# Patient Record
Sex: Female | Born: 1965 | Race: White | Hispanic: No | Marital: Married | Smoking: Never smoker
Health system: Southern US, Community
[De-identification: ages and names within clinical notes are randomized; demographics above are authoritative.]

## PROBLEM LIST (undated history)

## (undated) HISTORY — PX: BREAST BIOPSY: SHX20

## (undated) HISTORY — PX: BREAST EXCISIONAL BIOPSY: SUR124

## (undated) HISTORY — PX: BREAST SURGERY: SHX581

---

## 1997-05-15 ENCOUNTER — Inpatient Hospital Stay (HOSPITAL_COMMUNITY): Admission: AD | Admit: 1997-05-15 | Discharge: 1997-05-17 | Payer: Self-pay | Admitting: Obstetrics and Gynecology

## 1997-05-18 ENCOUNTER — Encounter: Admission: RE | Admit: 1997-05-18 | Discharge: 1997-08-16 | Payer: Self-pay | Admitting: *Deleted

## 1998-08-15 ENCOUNTER — Other Ambulatory Visit: Admission: RE | Admit: 1998-08-15 | Discharge: 1998-08-15 | Payer: Self-pay | Admitting: Obstetrics and Gynecology

## 1999-05-06 ENCOUNTER — Inpatient Hospital Stay (HOSPITAL_COMMUNITY): Admission: AD | Admit: 1999-05-06 | Discharge: 1999-05-09 | Payer: Self-pay | Admitting: Gynecology

## 1999-05-10 ENCOUNTER — Encounter: Admission: RE | Admit: 1999-05-10 | Discharge: 1999-07-03 | Payer: Self-pay | Admitting: Gynecology

## 1999-06-17 ENCOUNTER — Other Ambulatory Visit: Admission: RE | Admit: 1999-06-17 | Discharge: 1999-06-17 | Payer: Self-pay | Admitting: Gynecology

## 2000-10-21 ENCOUNTER — Other Ambulatory Visit: Admission: RE | Admit: 2000-10-21 | Discharge: 2000-10-21 | Payer: Self-pay | Admitting: Gynecology

## 2001-12-06 ENCOUNTER — Other Ambulatory Visit: Admission: RE | Admit: 2001-12-06 | Discharge: 2001-12-06 | Payer: Self-pay | Admitting: Gynecology

## 2003-02-13 ENCOUNTER — Other Ambulatory Visit: Admission: RE | Admit: 2003-02-13 | Discharge: 2003-02-13 | Payer: Self-pay | Admitting: Gynecology

## 2004-07-21 ENCOUNTER — Other Ambulatory Visit: Admission: RE | Admit: 2004-07-21 | Discharge: 2004-07-21 | Payer: Self-pay | Admitting: Gynecology

## 2005-07-23 ENCOUNTER — Other Ambulatory Visit: Admission: RE | Admit: 2005-07-23 | Discharge: 2005-07-23 | Payer: Self-pay | Admitting: Gynecology

## 2006-08-08 ENCOUNTER — Other Ambulatory Visit: Admission: RE | Admit: 2006-08-08 | Discharge: 2006-08-08 | Payer: Self-pay | Admitting: Gynecology

## 2007-08-28 ENCOUNTER — Other Ambulatory Visit: Admission: RE | Admit: 2007-08-28 | Discharge: 2007-08-28 | Payer: Self-pay | Admitting: Gynecology

## 2008-08-28 ENCOUNTER — Other Ambulatory Visit: Admission: RE | Admit: 2008-08-28 | Discharge: 2008-08-28 | Payer: Self-pay | Admitting: Gynecology

## 2008-08-28 ENCOUNTER — Encounter: Payer: Self-pay | Admitting: Gynecology

## 2008-08-28 ENCOUNTER — Ambulatory Visit: Payer: Self-pay | Admitting: Gynecology

## 2009-09-08 ENCOUNTER — Ambulatory Visit: Payer: Self-pay | Admitting: Gynecology

## 2009-09-08 ENCOUNTER — Other Ambulatory Visit: Admission: RE | Admit: 2009-09-08 | Discharge: 2009-09-08 | Payer: Self-pay | Admitting: Gynecology

## 2010-05-29 NOTE — Discharge Summary (Signed)
[facility]  Patient:    Deborah Terrell, Deborah Terrell                     MRN: [id number] Adm. Date:  [id number] Disc. Date: [id number] Attending:  Merrily Pew Dictator:   Antony Contras, [facility]                           Discharge Summary  DISCHARGE DIAGNOSES:                 1. Intrauterine pregnancy at term.                                      2. Breech presentation.  PROCEDURE:                           Low cervical transverse cesarean section                                      with delivery of viable infant.  HISTORY OF PRESENT ILLNESS:          Patient is a 45 year old gravida 2, para 1, 0-0-1, with an LMP 08/04/98, [facility] 05/10/99.  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  Patient was thus being followed by this per dermatologist.  PRENATAL BLOOD WORK:                 Blood type O positive, antibody screen negative.  Rubella positive.  RPR, HB, SAG, HIV nonreactive.  MSAFP within normal limits.  GBS negative.  HOSPITAL COURSE AND TREATMENT:       Patient was found to have a breech presentation near term.  Options for external cephalic version versus primary C-section were reviewed and the patient thus elected for a primary C-section. This procedure was performed on 05/06/99 by Dr. Audie Box assisted by Dr. Lily Peer under spinal anesthesia.  Patient was delivered of an Apgar 9/9 female infant.  Weight was 7 pounds 4 ounces.  Estimated blood loss less than 500 cc.  Postpartum course was uncomplicated.  Patient remained afebrile.  No difficulty with voiding.  Postoperative CBC: Hematocrit 28, hemoglobin 10, wbcs 9.9, platelets 190.  She was able to be discharged in satisfactory condition on her third postoperative day.  DISPOSITION:                         Follow up in the office in six weeks.  MEDICATIONS:                         1. Prenatal vitamins.                                      2. Iron.               3. Tylox and Motrin for pain. DD:  05/25/99 TD:  05/26/99 Job: [id number]

## 2010-05-29 NOTE — Op Note (Signed)
[facility]  Patient:    Deborah Terrell, Deborah Terrell                     MRN: [id number] Proc. Date: 05/06/99 Adm. Date:  [id number] Attending:  Merrily Pew                           Operative Report  PREOPERATIVE DIAGNOSIS:       Term pregnancy, breech presentation.  POSTOPERATIVE DIAGNOSIS:      Term pregnancy, breech presentation.  OPERATION:                    Primary low transverse cesarean section.  SURGEON:                      Timothy P. Fontaine, M.D.  ASSISTANTGaetano Hawthorne. Lily Peer, M.D.  ANESTHESIA:                   Spinal.  ESTIMATED BLOOD LOSS:         Less than 500 cc.  COMPLICATIONS:                None.  SPECIMEN:                     Samples of cord blood.  FINDINGS:                     At 88 normal female, Apgars 9 and 9, pelvic anatomy noted to be normal.  DESCRIPTION OF PROCEDURE:     The patient was taken to the operating room and underwent multiple attempts at spinal and epidural anesthesia per anesthesia record with ultimate successful spinal.  The patient was placed in the left tilt supine position and received an abdominal preparation with Betadine scrub and Betadine  solution and a Foley catheter was placed in sterile technique.  The patient was  then draped in the usual fashion and after assuring adequate anesthesia, the abdomen was sharply entered through a Pfannenstiel incision achieving adequate hemostasis at all levels.  The bladder flap was sharply and bluntly developed without difficulty.  The lower uterine segment was sharply incised and bluntly extended laterally.  The membranes ruptured, the fluid noted to be clear.  The infant was in the frank breech presentation and underwent a breech extraction without difficulty.  A nuchal cord x 1 was reduced and the nares and mouth suctioned. The cord doubly clamped and cut and the infant handed to pediatrics n attendance.  Samples of cord  blood were obtained.  The placenta was then spontaneously extruded and noted to be intact.  The uterus was exteriorized. The endometrial cavity explored with a sponge to remove all placental and membrane fragments and the uterine incision was closed in one layer using 0 Vicryl suture in a running interlocking stitch.  Several figure-of-eight sutures were placed along the incision line for complete hemostasis.  The uterus was returned to the abdomen which was copiously irrigated and adequate hemostasis was visualized.  The anterior fascia was reapproximated using 0 Vicryl suture in a running stitch.  The subcutaneous tissues irrigated and adequate hemostasis visualized and the skin as reapproximated with staples.  A sterile dressing was applied.  The patient was taken to the recovery room in good  condition having tolerated the procedure well. DD:  05/06/99 TD:  05/06/99 Job: [id number]

## 2010-07-20 ENCOUNTER — Encounter: Payer: Self-pay | Admitting: Anesthesiology

## 2010-09-16 ENCOUNTER — Ambulatory Visit (INDEPENDENT_AMBULATORY_CARE_PROVIDER_SITE_OTHER): Payer: BC Managed Care – PPO | Admitting: Gynecology

## 2010-09-16 ENCOUNTER — Other Ambulatory Visit (HOSPITAL_COMMUNITY)
Admission: RE | Admit: 2010-09-16 | Discharge: 2010-09-16 | Disposition: A | Discharge status: Home / Self Care | Payer: BC Managed Care – PPO | Source: Ambulatory Visit | Attending: Gynecology | Admitting: Gynecology

## 2010-09-16 ENCOUNTER — Encounter: Payer: Self-pay | Admitting: Gynecology

## 2010-09-16 VITALS — BP 118/76 | Ht 66.0 in | Wt 143.0 lb

## 2010-09-16 DIAGNOSIS — Z13220 Encounter for screening for lipoid disorders: Secondary | ICD-10-CM

## 2010-09-16 DIAGNOSIS — Z01419 Encounter for gynecological examination (general) (routine) without abnormal findings: Secondary | ICD-10-CM

## 2010-09-16 DIAGNOSIS — Z131 Encounter for screening for diabetes mellitus: Secondary | ICD-10-CM

## 2010-09-16 NOTE — Progress Notes (Signed)
Deborah Terrell 07-30-1965 [id number]        45 y.o.  for annual exam.  Doing well no complaints. Husband has had a vasectomy.  Past medical history,surgical history, medications, allergies, family history and social history were all reviewed and documented in the EPIC chart. ROS:  Was performed and pertinent positives and negatives are included in the history.  Exam: chaperone present Filed Vitals:   09/16/10 1529  BP: 118/76   General appearance  Normal Skin grossly normal Head/Neck normal with no cervical or supraclavicular adenopathy thyroid normal Lungs  clear Cardiac RR, without RMG Abdominal  soft, nontender, without masses, organomegaly or hernia Breasts  examined lying and sitting without masses, retractions, discharge or axillary adenopathy. Pelvic  Ext/BUS/vagina  normal   Cervix  normal  Pap done  Uterus  anteverted, normal size, shape and contour, midline and mobile nontender   Adnexa  Without masses or tenderness    Anus and perineum  normal   Rectovaginal  normal sphincter tone without palpated masses or tenderness.    Assessment/Plan:  45 y.o. female for annual exam.   Doing well regular menses vasectomy birth control. Self breast exams on a monthly basis discussed and urged.  Just had followup views of her breasts today tutus some microcalcifications that were seen. I do not have the final report they told her that they had recommended a six-month followup study. I reviewed with her that probably benign also means possibly malignant. She is comfortable with the six-month followup recommendation assuming that this is what the final report shows and she will follow up with them in 6 months. Baseline CBC urinalysis glucose and lipid profile were ordered. Assuming she continues well from a gynecologic standpoint she'll see me in a year sooner as needed.    Dara Lords MD, 4:18 PM 09/16/2010

## 2011-04-12 ENCOUNTER — Encounter: Payer: Self-pay | Admitting: Gynecology

## 2011-08-20 ENCOUNTER — Other Ambulatory Visit: Payer: Self-pay | Admitting: Dermatology

## 2011-10-01 ENCOUNTER — Encounter: Payer: Self-pay | Admitting: Gynecology

## 2011-11-25 ENCOUNTER — Ambulatory Visit (INDEPENDENT_AMBULATORY_CARE_PROVIDER_SITE_OTHER): Payer: BC Managed Care – PPO | Admitting: Gynecology

## 2011-11-25 ENCOUNTER — Encounter: Payer: Self-pay | Admitting: Gynecology

## 2011-11-25 VITALS — BP 114/72 | Ht 65.0 in | Wt 148.0 lb

## 2011-11-25 DIAGNOSIS — N951 Menopausal and female climacteric states: Secondary | ICD-10-CM

## 2011-11-25 DIAGNOSIS — Z01419 Encounter for gynecological examination (general) (routine) without abnormal findings: Secondary | ICD-10-CM

## 2011-11-25 LAB — CBC WITH DIFFERENTIAL/PLATELET
Lymphocytes Relative: 24 % (ref 12–46)
Lymphs Abs: 1.6 10*3/uL (ref 0.7–4.0)
Neutrophils Relative %: 63 % (ref 43–77)
Platelets: 253 10*3/uL (ref 150–400)
RBC: 4.2 MIL/uL (ref 3.87–5.11)
WBC: 6.7 10*3/uL (ref 4.0–10.5)

## 2011-11-25 NOTE — Progress Notes (Signed)
Deborah Terrell Mar 17, 1965 [id number]        46 y.o.  G2P2002 for annual exam.  Overall doing well. One issue noted below.  Past medical history,surgical history, medications, allergies, family history and social history were all reviewed and documented in the EPIC chart. ROS:  Was performed and pertinent positives and negatives are included in the history.  Exam: Charity fundraiser Vitals:   11/25/11 1543  BP: 114/72  Height: 5\' 5"  (1.651 m)  Weight: 148 lb (67.132 kg)   General appearance  Normal Skin grossly normal Head/Neck normal with no cervical or supraclavicular adenopathy thyroid normal Lungs  clear Cardiac RR, without RMG Abdominal  soft, nontender, without masses, organomegaly or hernia Breasts  examined lying and sitting without masses, retractions, discharge or axillary adenopathy. Pelvic  Ext/BUS/vagina  normal   Cervix  normal   Uterus  anteverted, normal size, shape and contour, midline and mobile nontender   Adnexa  Without masses or tenderness    Anus and perineum  normal   Rectovaginal  normal sphincter tone without palpated masses or tenderness.    Assessment/Plan:  46 y.o. G55P2002 female for annual exam, regular menses, vasectomy birth control.   1. Menopausal symptoms. Patient does note at night she will wake up with a night sweat/hot flash.  Thinks it is related to her eating is it consistently happens about 6 hours after eating. No nausea vomiting diarrhea constipation or other constitutional symptoms. No weight changes skin changes hair changes. Menses are regular normal flow. We'll check baseline TSH FSH comprehensive metabolic panel and CBC. She will normal them we'll follow up present. 2. Mammography September 2013. We'll continue annual mammography. SBE reviewed. 3. Pap smear. No Pap smear done today. Last Pap smear 2012. No history of abnormal Pap smears. We'll plan every 3 year screening. 4. Health maintenance. Will check above blood work with  urinalysis. Lipid profile last year was normal will not repeat.  Assuming she continues well, follow up one year, sooner as needed    Dara Lords MD, 4:37 PM 11/25/2011

## 2011-11-25 NOTE — Patient Instructions (Signed)
Follow up in one year for annual exam 

## 2011-11-26 LAB — COMPREHENSIVE METABOLIC PANEL
ALT: 14 U/L (ref 0–35)
CO2: 24 mEq/L (ref 19–32)
Calcium: 9.7 mg/dL (ref 8.4–10.5)
Chloride: 103 mEq/L (ref 96–112)
Potassium: 4.1 mEq/L (ref 3.5–5.3)
Sodium: 136 mEq/L (ref 135–145)
Total Protein: 6.9 g/dL (ref 6.0–8.3)

## 2011-11-26 LAB — URINALYSIS W MICROSCOPIC + REFLEX CULTURE
Bacteria, UA: NONE SEEN
Crystals: NONE SEEN
Ketones, ur: NEGATIVE mg/dL
Leukocytes, UA: NEGATIVE
Nitrite: NEGATIVE
Specific Gravity, Urine: 1.025 (ref 1.005–1.030)
Urobilinogen, UA: 0.2 mg/dL (ref 0.0–1.0)

## 2011-11-26 LAB — TSH: TSH: 2.057 u[IU]/mL (ref 0.350–4.500)

## 2012-02-09 ENCOUNTER — Other Ambulatory Visit: Payer: Self-pay | Admitting: Dermatology

## 2012-02-26 ENCOUNTER — Other Ambulatory Visit: Payer: Self-pay

## 2012-03-30 ENCOUNTER — Encounter: Payer: Self-pay | Admitting: Gynecology

## 2012-09-22 ENCOUNTER — Telehealth: Payer: Self-pay | Admitting: *Deleted

## 2012-09-22 NOTE — Telephone Encounter (Signed)
Deborah Terrell from [facility] called and asked if order for 6 month follow up of left breast could be faxed to [phone]. JF signed this and order was faxed for diag. Mammogram and ultrasound.

## 2012-09-26 ENCOUNTER — Encounter: Payer: Self-pay | Admitting: Gynecology

## 2012-10-19 ENCOUNTER — Other Ambulatory Visit: Payer: Self-pay | Admitting: Dermatology

## 2012-11-16 ENCOUNTER — Other Ambulatory Visit: Payer: Self-pay

## 2012-11-16 ENCOUNTER — Other Ambulatory Visit: Payer: Self-pay | Admitting: Dermatology

## 2013-02-12 ENCOUNTER — Other Ambulatory Visit (HOSPITAL_COMMUNITY)
Admission: RE | Admit: 2013-02-12 | Discharge: 2013-02-12 | Disposition: A | Discharge status: Home / Self Care | Payer: BC Managed Care – PPO | Source: Ambulatory Visit | Attending: Gynecology | Admitting: Gynecology

## 2013-02-12 ENCOUNTER — Ambulatory Visit (INDEPENDENT_AMBULATORY_CARE_PROVIDER_SITE_OTHER): Payer: BC Managed Care – PPO | Admitting: Gynecology

## 2013-02-12 ENCOUNTER — Encounter: Payer: Self-pay | Admitting: Gynecology

## 2013-02-12 VITALS — BP 112/66 | Ht 66.0 in | Wt 146.0 lb

## 2013-02-12 DIAGNOSIS — Z1151 Encounter for screening for human papillomavirus (HPV): Secondary | ICD-10-CM | POA: Insufficient documentation

## 2013-02-12 DIAGNOSIS — Z01419 Encounter for gynecological examination (general) (routine) without abnormal findings: Secondary | ICD-10-CM

## 2013-02-12 NOTE — Addendum Note (Signed)
Addended by: Nelva Nay on: 02/12/2013 04:10 PM   Modules accepted: Orders

## 2013-02-12 NOTE — Progress Notes (Signed)
Deborah Terrell 07-31-1965 [id number]        48 y.o.  G2P2002 for annual exam.  Several issues noted below.  Past medical history,surgical history, problem list, medications, allergies, family history and social history were all reviewed and documented in the EPIC chart.  ROS:  Performed and pertinent positives and negatives are included in the history, assessment and plan .  Exam: Kim assistant Filed Vitals:   02/12/13 1522  BP: 112/66  Height: 5\' 6"  (1.676 m)  Weight: 146 lb (66.225 kg)   General appearance  Normal Skin grossly normal Head/Neck normal with no cervical or supraclavicular adenopathy thyroid normal Lungs  clear Cardiac RR, without RMG Abdominal  soft, nontender, without masses, organomegaly or hernia Breasts  examined lying and sitting without masses, retractions, discharge or axillary adenopathy. Pelvic  Ext/BUS/vagina  Normal  Cervix  Normal. Pap/HPV  Uterus  anteverted, normal size, shape and contour, midline and mobile nontender   Adnexa  Without masses or tenderness    Anus and perineum  Normal   Rectovaginal  Normal sphincter tone without palpated masses or tenderness.    Assessment/Plan:  48 y.o. G85P2002 female for annual exam regular menses, vasectomy birth control.   1. History of menopausal symptoms to include night sweats and sweating episodes last year when I saw her. These have resolved and she is doing well now. Her TSH FSH last year was normal. Will continue to monitor.  2. Mammography 09/2012. Had some abnormal calcifications ultimately led to biopsy which was benign. This was done.  in [facility].  Was told to followup in 6 months. Exam today is normal. Continue with SBE monthly. Followup for reexamination per their recommendation. 3. Pap smear 2012. Pap/HPV today. No history of abnormal Pap smears previously. Repeat at 3-5 year intervals in the negative. 4. Health maintenance. Baseline CBC comprehensive metabolic panel lipid profile urinalysis  ordered. Followup one year, sooner as needed.   Note: This document was prepared with digital dictation and possible smart phrase technology. Any transcriptional errors that result from this process are unintentional.   Anastasio Auerbach MD, 3:51 PM 02/12/2013

## 2013-02-12 NOTE — Patient Instructions (Signed)
Follow up in one year, sooner as needed. 

## 2013-02-13 ENCOUNTER — Other Ambulatory Visit: Payer: Self-pay | Admitting: Gynecology

## 2013-02-13 DIAGNOSIS — E781 Pure hyperglyceridemia: Secondary | ICD-10-CM

## 2013-02-13 LAB — COMPREHENSIVE METABOLIC PANEL
ALT: 11 U/L (ref 0–35)
AST: 14 U/L (ref 0–37)
Albumin: 4.5 g/dL (ref 3.5–5.2)
Alkaline Phosphatase: 57 U/L (ref 39–117)
BUN: 12 mg/dL (ref 6–23)
CO2: 25 mEq/L (ref 19–32)
Calcium: 9.9 mg/dL (ref 8.4–10.5)
Chloride: 105 mEq/L (ref 96–112)
Creat: 0.85 mg/dL (ref 0.50–1.10)
Glucose, Bld: 87 mg/dL (ref 70–99)
Potassium: 4.1 mEq/L (ref 3.5–5.3)
Sodium: 139 mEq/L (ref 135–145)
Total Bilirubin: 0.3 mg/dL (ref 0.2–1.2)
Total Protein: 6.9 g/dL (ref 6.0–8.3)

## 2013-02-13 LAB — CBC WITH DIFFERENTIAL/PLATELET
BASOS ABS: 0 10*3/uL (ref 0.0–0.1)
BASOS PCT: 1 % (ref 0–1)
EOS PCT: 3 % (ref 0–5)
Eosinophils Absolute: 0.1 10*3/uL (ref 0.0–0.7)
HCT: 37.3 % (ref 36.0–46.0)
Hemoglobin: 12.7 g/dL (ref 12.0–15.0)
Lymphocytes Relative: 25 % (ref 12–46)
Lymphs Abs: 1.3 10*3/uL (ref 0.7–4.0)
MCH: 32.5 pg (ref 26.0–34.0)
MCHC: 34 g/dL (ref 30.0–36.0)
MCV: 95.4 fL (ref 78.0–100.0)
Monocytes Absolute: 0.4 10*3/uL (ref 0.1–1.0)
Monocytes Relative: 8 % (ref 3–12)
Neutro Abs: 3.4 10*3/uL (ref 1.7–7.7)
Neutrophils Relative %: 63 % (ref 43–77)
Platelets: 269 10*3/uL (ref 150–400)
RBC: 3.91 MIL/uL (ref 3.87–5.11)
RDW: 13.1 % (ref 11.5–15.5)
WBC: 5.3 10*3/uL (ref 4.0–10.5)

## 2013-02-13 LAB — URINALYSIS W MICROSCOPIC + REFLEX CULTURE
BILIRUBIN URINE: NEGATIVE
Bacteria, UA: NONE SEEN
Casts: NONE SEEN
Crystals: NONE SEEN
Glucose, UA: NEGATIVE mg/dL
HGB URINE DIPSTICK: NEGATIVE
Ketones, ur: NEGATIVE mg/dL
Leukocytes, UA: NEGATIVE
Nitrite: NEGATIVE
PROTEIN: NEGATIVE mg/dL
SQUAMOUS EPITHELIAL / LPF: NONE SEEN
Specific Gravity, Urine: 1.018 (ref 1.005–1.030)
UROBILINOGEN UA: 0.2 mg/dL (ref 0.0–1.0)
pH: 5.5 (ref 5.0–8.0)

## 2013-02-13 LAB — LIPID PANEL
Cholesterol: 168 mg/dL (ref 0–200)
HDL: 52 mg/dL (ref >39)
LDL Cholesterol: 62 mg/dL (ref 0–99)
Total CHOL/HDL Ratio: 3.2 Ratio
Triglycerides: 268 mg/dL — ABNORMAL HIGH (ref <150)
VLDL: 54 mg/dL — AB (ref 0–40)

## 2013-03-09 ENCOUNTER — Other Ambulatory Visit: Payer: BC Managed Care – PPO

## 2013-04-18 ENCOUNTER — Other Ambulatory Visit: Payer: BC Managed Care – PPO

## 2013-04-18 DIAGNOSIS — E781 Pure hyperglyceridemia: Secondary | ICD-10-CM

## 2013-04-18 LAB — LIPID PANEL
CHOLESTEROL: 156 mg/dL (ref 0–200)
HDL: 44 mg/dL (ref >39)
LDL CALC: 90 mg/dL (ref 0–99)
TRIGLYCERIDES: 108 mg/dL (ref <150)
Total CHOL/HDL Ratio: 3.5 Ratio
VLDL: 22 mg/dL (ref 0–40)

## 2013-04-19 ENCOUNTER — Encounter: Payer: Self-pay | Admitting: Gynecology

## 2013-11-12 ENCOUNTER — Encounter: Payer: Self-pay | Admitting: Gynecology

## 2014-05-06 ENCOUNTER — Encounter: Payer: Self-pay | Admitting: Gynecology

## 2014-05-07 ENCOUNTER — Telehealth: Payer: Self-pay

## 2014-05-07 ENCOUNTER — Encounter: Payer: BC Managed Care – PPO | Admitting: Gynecology

## 2014-05-07 NOTE — Telephone Encounter (Signed)
Patient emailed inquiring if she should come for CE at [place] today. One end of her menses and is spotting. At her request I left a phone message. I told her it should be fine. She had a pap last year that was normal and is on a 3-5 year cycle with that so no pap will be done. I told her the only thing it could interfere with would be checking a wet prep if she was having any vag inf sx.

## 2014-07-03 ENCOUNTER — Ambulatory Visit (INDEPENDENT_AMBULATORY_CARE_PROVIDER_SITE_OTHER): Payer: BC Managed Care – PPO | Admitting: Gynecology

## 2014-07-03 ENCOUNTER — Encounter: Payer: Self-pay | Admitting: Gynecology

## 2014-07-03 VITALS — BP 110/76 | Ht 66.0 in | Wt 152.0 lb

## 2014-07-03 DIAGNOSIS — Z01419 Encounter for gynecological examination (general) (routine) without abnormal findings: Secondary | ICD-10-CM

## 2014-07-03 LAB — CBC WITH DIFFERENTIAL/PLATELET
BASOS PCT: 1 % (ref 0–1)
Basophils Absolute: 0 10*3/uL (ref 0.0–0.1)
EOS ABS: 0.2 10*3/uL (ref 0.0–0.7)
Eosinophils Relative: 4 % (ref 0–5)
HEMATOCRIT: 40.4 % (ref 36.0–46.0)
Hemoglobin: 13.6 g/dL (ref 12.0–15.0)
Lymphocytes Relative: 28 % (ref 12–46)
Lymphs Abs: 1.3 10*3/uL (ref 0.7–4.0)
MCH: 31.3 pg (ref 26.0–34.0)
MCHC: 33.7 g/dL (ref 30.0–36.0)
MCV: 93.1 fL (ref 78.0–100.0)
MPV: 9.8 fL (ref 8.6–12.4)
Monocytes Absolute: 0.4 10*3/uL (ref 0.1–1.0)
Monocytes Relative: 9 % (ref 3–12)
NEUTROS ABS: 2.8 10*3/uL (ref 1.7–7.7)
Neutrophils Relative %: 58 % (ref 43–77)
Platelets: 245 10*3/uL (ref 150–400)
RBC: 4.34 MIL/uL (ref 3.87–5.11)
RDW: 13.1 % (ref 11.5–15.5)
WBC: 4.8 10*3/uL (ref 4.0–10.5)

## 2014-07-03 LAB — URINALYSIS W MICROSCOPIC + REFLEX CULTURE
Bilirubin Urine: NEGATIVE
Casts: NONE SEEN
Crystals: NONE SEEN
Glucose, UA: NEGATIVE mg/dL
HGB URINE DIPSTICK: NEGATIVE
KETONES UR: NEGATIVE mg/dL
Leukocytes, UA: NEGATIVE
Nitrite: NEGATIVE
Protein, ur: NEGATIVE mg/dL
Urobilinogen, UA: 0.2 mg/dL (ref 0.0–1.0)
pH: 5.5 (ref 5.0–8.0)

## 2014-07-03 LAB — COMPREHENSIVE METABOLIC PANEL
ALK PHOS: 52 U/L (ref 39–117)
ALT: 13 U/L (ref 0–35)
AST: 11 U/L (ref 0–37)
Albumin: 4.3 g/dL (ref 3.5–5.2)
BUN: 14 mg/dL (ref 6–23)
CO2: 24 mEq/L (ref 19–32)
Calcium: 9.3 mg/dL (ref 8.4–10.5)
Chloride: 104 mEq/L (ref 96–112)
Creat: 0.81 mg/dL (ref 0.50–1.10)
Glucose, Bld: 88 mg/dL (ref 70–99)
POTASSIUM: 4.1 meq/L (ref 3.5–5.3)
SODIUM: 138 meq/L (ref 135–145)
TOTAL PROTEIN: 6.8 g/dL (ref 6.0–8.3)
Total Bilirubin: 0.5 mg/dL (ref 0.2–1.2)

## 2014-07-03 LAB — LIPID PANEL
CHOLESTEROL: 174 mg/dL (ref 0–200)
HDL: 52 mg/dL (ref >=46)
LDL Cholesterol: 90 mg/dL (ref 0–99)
TRIGLYCERIDES: 159 mg/dL — AB (ref <150)
Total CHOL/HDL Ratio: 3.3 Ratio
VLDL: 32 mg/dL (ref 0–40)

## 2014-07-03 NOTE — Progress Notes (Signed)
Deborah Terrell 1965/10/10 [id number]        49 y.o.  G2P2002 for annual exam.  Doing well without complaints  Past medical history,surgical history, problem list, medications, allergies, family history and social history were all reviewed and documented as reviewed in the EPIC chart.  ROS:  Performed with pertinent positives and negatives included in the history, assessment and plan.   Additional significant findings :  none   Exam: [name] Vitals:   07/03/14 1053  BP: 110/76  Height: 5\' 6"  (1.676 m)  Weight: 152 lb (68.947 kg)   General appearance:  Normal affect, orientation and appearance. Skin: Grossly normal HEENT: Without gross lesions.  No cervical or supraclavicular adenopathy. Thyroid normal.  Lungs:  Clear without wheezing, rales or rhonchi Cardiac: RR, without RMG Abdominal:  Soft, nontender, without masses, guarding, rebound, organomegaly or hernia Breasts:  Examined lying and sitting without masses, retractions, discharge or axillary adenopathy. Pelvic:  Ext/BUS/vagina normal  Cervix normal  Uterus anteverted, normal size, shape and contour, midline and mobile nontender   Adnexa  Without masses or tenderness    Anus and perineum  Normal   Rectovaginal  Normal sphincter tone without palpated masses or tenderness.    Assessment/Plan:  49 y.o. G100P2002 female for annual exam with regular menses, vasectomy birth control.   1. Pap/HPV negative 2015. No Pap smear done today. No history of abnormal Pap smears previously. Plan repeat Pap smear in 3-5 year interval per current screening guidelines. 2. Mammography 04/2014. Continue with annual mammography. SBE monthly reviewed. 3. Health maintenance. Baseline CBC, comprehensive metabolic panel, lipid profile, urinalysis ordered. Follow up in one year, sooner as needed.   Anastasio Auerbach MD, 11:26 AM 07/03/2014

## 2014-07-03 NOTE — Patient Instructions (Signed)
You may obtain a copy of any labs that were done today by logging onto MyChart as outlined in the instructions provided with your AVS (after visit summary). The office will not call with normal lab results but certainly if there are any significant abnormalities then we will contact you.   Health Maintenance, Female A healthy lifestyle and preventative care can promote health and wellness.  Maintain regular health, dental, and eye exams.  Eat a healthy diet. Foods like vegetables, fruits, whole grains, low-fat dairy products, and lean protein foods contain the nutrients you need without too many calories. Decrease your intake of foods high in solid fats, added sugars, and salt. Get information about a proper diet from your caregiver, if necessary.  Regular physical exercise is one of the most important things you can do for your health. Most adults should get at least 150 minutes of moderate-intensity exercise (any activity that increases your heart rate and causes you to sweat) each week. In addition, most adults need muscle-strengthening exercises on 2 or more days a week.   Maintain a healthy weight. The body mass index (BMI) is a screening tool to identify possible weight problems. It provides an estimate of body fat based on height and weight. Your caregiver can help determine your BMI, and can help you achieve or maintain a healthy weight. For adults 20 years and older:  A BMI below 18.5 is considered underweight.  A BMI of 18.5 to 24.9 is normal.  A BMI of 25 to 29.9 is considered overweight.  A BMI of 30 and above is considered obese.  Maintain normal blood lipids and cholesterol by exercising and minimizing your intake of saturated fat. Eat a balanced diet with plenty of fruits and vegetables. Blood tests for lipids and cholesterol should begin at age 61 and be repeated every 5 years. If your lipid or cholesterol levels are high, you are over 50, or you are a high risk for heart  disease, you may need your cholesterol levels checked more frequently.Ongoing high lipid and cholesterol levels should be treated with medicines if diet and exercise are not effective.  If you smoke, find out from your caregiver how to quit. If you do not use tobacco, do not start.  Lung cancer screening is recommended for adults aged 33 80 years who are at high risk for developing lung cancer because of a history of smoking. Yearly low-dose computed tomography (CT) is recommended for people who have at least a 30-pack-year history of smoking and are a current smoker or have quit within the past 15 years. A pack year of smoking is smoking an average of 1 pack of cigarettes a day for 1 year (for example: 1 pack a day for 30 years or 2 packs a day for 15 years). Yearly screening should continue until the smoker has stopped smoking for at least 15 years. Yearly screening should also be stopped for people who develop a health problem that would prevent them from having lung cancer treatment.  If you are pregnant, do not drink alcohol. If you are breastfeeding, be very cautious about drinking alcohol. If you are not pregnant and choose to drink alcohol, do not exceed 1 drink per day. One drink is considered to be 12 ounces (355 mL) of beer, 5 ounces (148 mL) of wine, or 1.5 ounces (44 mL) of liquor.  Avoid use of street drugs. Do not share needles with anyone. Ask for help if you need support or instructions about stopping  the use of drugs.  High blood pressure causes heart disease and increases the risk of stroke. Blood pressure should be checked at least every 1 to 2 years. Ongoing high blood pressure should be treated with medicines, if weight loss and exercise are not effective.  If you are 59 to 48 years old, ask your caregiver if you should take aspirin to prevent strokes.  Diabetes screening involves taking a blood sample to check your fasting blood sugar level. This should be done once every 3  years, after age 91, if you are within normal weight and without risk factors for diabetes. Testing should be considered at a younger age or be carried out more frequently if you are overweight and have at least 1 risk factor for diabetes.  Breast cancer screening is essential preventative care for women. You should practice "breast self-awareness." This means understanding the normal appearance and feel of your breasts and may include breast self-examination. Any changes detected, no matter how small, should be reported to a caregiver. Women in their 66s and 30s should have a clinical breast exam (CBE) by a caregiver as part of a regular health exam every 1 to 3 years. After age 101, women should have a CBE every year. Starting at age 100, women should consider having a mammogram (breast X-ray) every year. Women who have a family history of breast cancer should talk to their caregiver about genetic screening. Women at a high risk of breast cancer should talk to their caregiver about having an MRI and a mammogram every year.  Breast cancer gene (BRCA)-related cancer risk assessment is recommended for women who have family members with BRCA-related cancers. BRCA-related cancers include breast, ovarian, tubal, and peritoneal cancers. Having family members with these cancers may be associated with an increased risk for harmful changes (mutations) in the breast cancer genes BRCA1 and BRCA2. Results of the assessment will determine the need for genetic counseling and BRCA1 and BRCA2 testing.  The Pap test is a screening test for cervical cancer. Women should have a Pap test starting at age 57. Between ages 25 and 35, Pap tests should be repeated every 2 years. Beginning at age 37, you should have a Pap test every 3 years as long as the past 3 Pap tests have been normal. If you had a hysterectomy for a problem that was not cancer or a condition that could lead to cancer, then you no longer need Pap tests. If you are  between ages 50 and 76, and you have had normal Pap tests going back 10 years, you no longer need Pap tests. If you have had past treatment for cervical cancer or a condition that could lead to cancer, you need Pap tests and screening for cancer for at least 20 years after your treatment. If Pap tests have been discontinued, risk factors (such as a new sexual partner) need to be reassessed to determine if screening should be resumed. Some women have medical problems that increase the chance of getting cervical cancer. In these cases, your caregiver may recommend more frequent screening and Pap tests.  The human papillomavirus (HPV) test is an additional test that may be used for cervical cancer screening. The HPV test looks for the virus that can cause the cell changes on the cervix. The cells collected during the Pap test can be tested for HPV. The HPV test could be used to screen women aged 44 years and older, and should be used in women of any age  who have unclear Pap test results. After the age of 55, women should have HPV testing at the same frequency as a Pap test.  Colorectal cancer can be detected and often prevented. Most routine colorectal cancer screening begins at the age of 44 and continues through age 20. However, your caregiver may recommend screening at an earlier age if you have risk factors for colon cancer. On a yearly basis, your caregiver may provide home test kits to check for hidden blood in the stool. Use of a small camera at the end of a tube, to directly examine the colon (sigmoidoscopy or colonoscopy), can detect the earliest forms of colorectal cancer. Talk to your caregiver about this at age 86, when routine screening begins. Direct examination of the colon should be repeated every 5 to 10 years through age 13, unless early forms of pre-cancerous polyps or small growths are found.  Hepatitis C blood testing is recommended for all people born from [birth year] through 1964 and any  individual with known risks for hepatitis C.  Practice safe sex. Use condoms and avoid high-risk sexual practices to reduce the spread of sexually transmitted infections (STIs). Sexually active women aged 36 and younger should be checked for Chlamydia, which is a common sexually transmitted infection. Older women with new or multiple partners should also be tested for Chlamydia. Testing for other STIs is recommended if you are sexually active and at increased risk.  Osteoporosis is a disease in which the bones lose minerals and strength with aging. This can result in serious bone fractures. The risk of osteoporosis can be identified using a bone density scan. Women ages 20 and over and women at risk for fractures or osteoporosis should discuss screening with their caregivers. Ask your caregiver whether you should be taking a calcium supplement or vitamin D to reduce the rate of osteoporosis.  Menopause can be associated with physical symptoms and risks. Hormone replacement therapy is available to decrease symptoms and risks. You should talk to your caregiver about whether hormone replacement therapy is right for you.  Use sunscreen. Apply sunscreen liberally and repeatedly throughout the day. You should seek shade when your shadow is shorter than you. Protect yourself by wearing long sleeves, pants, a wide-brimmed hat, and sunglasses year round, whenever you are outdoors.  Notify your caregiver of new moles or changes in moles, especially if there is a change in shape or color. Also notify your caregiver if a mole is larger than the size of a pencil eraser.  Stay current with your immunizations. Document Released: 07/13/2010 Document Revised: 04/24/2012 Document Reviewed: 07/13/2010 [facility] Patient Information 2014 [employer].

## 2014-07-05 LAB — URINE CULTURE
Colony Count: NO GROWTH
Organism ID, Bacteria: NO GROWTH

## 2014-10-18 ENCOUNTER — Encounter: Payer: Self-pay | Admitting: Gynecology

## 2015-06-27 ENCOUNTER — Encounter: Payer: Self-pay | Admitting: Gynecology

## 2015-07-04 ENCOUNTER — Encounter: Payer: Self-pay | Admitting: Gynecology

## 2015-07-04 ENCOUNTER — Ambulatory Visit (INDEPENDENT_AMBULATORY_CARE_PROVIDER_SITE_OTHER): Payer: BC Managed Care – PPO | Admitting: Gynecology

## 2015-07-04 VITALS — BP 114/70 | Ht 66.0 in | Wt 144.0 lb

## 2015-07-04 DIAGNOSIS — Z13220 Encounter for screening for lipoid disorders: Secondary | ICD-10-CM

## 2015-07-04 DIAGNOSIS — Z01419 Encounter for gynecological examination (general) (routine) without abnormal findings: Secondary | ICD-10-CM | POA: Diagnosis not present

## 2015-07-04 LAB — CBC WITH DIFFERENTIAL/PLATELET
BASOS ABS: 49 {cells}/uL (ref 0–200)
Basophils Relative: 1 %
EOS PCT: 4 %
Eosinophils Absolute: 196 cells/uL (ref 15–500)
HCT: 40 % (ref 35.0–45.0)
Hemoglobin: 13.6 g/dL (ref 11.7–15.5)
LYMPHS PCT: 20 %
Lymphs Abs: 980 cells/uL (ref 850–3900)
MCH: 32.4 pg (ref 27.0–33.0)
MCHC: 34 g/dL (ref 32.0–36.0)
MCV: 95.2 fL (ref 80.0–100.0)
MONOS PCT: 9 %
MPV: 9.7 fL (ref 7.5–12.5)
Monocytes Absolute: 441 cells/uL (ref 200–950)
NEUTROS PCT: 66 %
Neutro Abs: 3234 cells/uL (ref 1500–7800)
Platelets: 232 10*3/uL (ref 140–400)
RBC: 4.2 MIL/uL (ref 3.80–5.10)
RDW: 12.9 % (ref 11.0–15.0)
WBC: 4.9 10*3/uL (ref 3.8–10.8)

## 2015-07-04 LAB — COMPREHENSIVE METABOLIC PANEL
ALBUMIN: 4.2 g/dL (ref 3.6–5.1)
ALT: 20 U/L (ref 6–29)
AST: 16 U/L (ref 10–35)
Alkaline Phosphatase: 47 U/L (ref 33–115)
BUN: 17 mg/dL (ref 7–25)
CHLORIDE: 104 mmol/L (ref 98–110)
CO2: 22 mmol/L (ref 20–31)
Calcium: 9.2 mg/dL (ref 8.6–10.2)
Creat: 0.86 mg/dL (ref 0.50–1.10)
Glucose, Bld: 77 mg/dL (ref 65–99)
POTASSIUM: 4.2 mmol/L (ref 3.5–5.3)
SODIUM: 139 mmol/L (ref 135–146)
Total Bilirubin: 0.5 mg/dL (ref 0.2–1.2)
Total Protein: 6.9 g/dL (ref 6.1–8.1)

## 2015-07-04 LAB — LIPID PANEL
CHOLESTEROL: 178 mg/dL (ref 125–200)
HDL: 59 mg/dL (ref >=46)
LDL CALC: 90 mg/dL (ref <130)
TRIGLYCERIDES: 145 mg/dL (ref <150)
Total CHOL/HDL Ratio: 3 Ratio (ref <=5.0)
VLDL: 29 mg/dL (ref <30)

## 2015-07-04 NOTE — Patient Instructions (Signed)

## 2015-07-04 NOTE — Progress Notes (Signed)
    Deborah Terrell 06-05-1965 [id number]        50 y.o.  G2P2002  for annual exam.  Doing well without complaints  Past medical history,surgical history, problem list, medications, allergies, family history and social history were all reviewed and documented as reviewed in the EPIC chart.  ROS:  Performed with pertinent positives and negatives included in the history, assessment and plan.   Additional significant findings :  None   Exam: Deborah Terrell assistant Filed Vitals:   07/04/15 0859  BP: 114/70  Height: 5\' 6"  (1.676 m)  Weight: 144 lb (65.318 kg)   General appearance:  Normal affect, orientation and appearance. Skin: Grossly normal HEENT: Without gross lesions.  No cervical or supraclavicular adenopathy. Thyroid normal.  Lungs:  Clear without wheezing, rales or rhonchi Cardiac: RR, without RMG Abdominal:  Soft, nontender, without masses, guarding, rebound, organomegaly or hernia Breasts:  Examined lying and sitting without masses, retractions, discharge or axillary adenopathy. Pelvic:  Ext/BUS/vagina normal  Cervix normal  Uterus anteverted, normal size, shape and contour, midline and mobile nontender   Adnexa without masses or tenderness    Anus and perineum normal   Rectovaginal normal sphincter tone without palpated masses or tenderness.    Assessment/Plan:  50 y.o. G59P2002 female for annual exam with regular menses, vasectomy birth control.   1. Pap smear/HPV negative 2015. No Pap smear done today. No history of abnormal Pap smears previously. Plan repeat Pap smear approaching 5 year interval per current screening guidelines. 2. Mammography 10/2014. Continue with annual mammography when due. SBE monthly reviewed. 3. Health maintenance. Baseline CBC, CMP, lipid profile, urinalysis ordered. Follow up 1 year, sooner as needed.   Deborah Auerbach MD, 9:51 AM 07/04/2015

## 2015-07-05 LAB — URINALYSIS W MICROSCOPIC + REFLEX CULTURE
BACTERIA UA: NONE SEEN [HPF]
Bilirubin Urine: NEGATIVE
Casts: NONE SEEN [LPF]
Crystals: NONE SEEN [HPF]
Glucose, UA: NEGATIVE
HGB URINE DIPSTICK: NEGATIVE
KETONES UR: NEGATIVE
LEUKOCYTES UA: NEGATIVE
NITRITE: NEGATIVE
PROTEIN: NEGATIVE
RBC / HPF: NONE SEEN RBC/HPF (ref <=2)
Specific Gravity, Urine: 1.021 (ref 1.001–1.035)
WBC UA: NONE SEEN WBC/HPF (ref <=5)
YEAST: NONE SEEN [HPF]
pH: 5.5 (ref 5.0–8.0)

## 2015-07-08 ENCOUNTER — Encounter: Payer: BC Managed Care – PPO | Admitting: Gynecology

## 2015-11-20 ENCOUNTER — Encounter: Payer: Self-pay | Admitting: Gynecology

## 2016-07-08 ENCOUNTER — Ambulatory Visit (INDEPENDENT_AMBULATORY_CARE_PROVIDER_SITE_OTHER): Payer: BC Managed Care – PPO | Admitting: Gynecology

## 2016-07-08 ENCOUNTER — Encounter: Payer: Self-pay | Admitting: Gynecology

## 2016-07-08 VITALS — BP 116/74 | Ht 66.0 in | Wt 150.0 lb

## 2016-07-08 DIAGNOSIS — Z01419 Encounter for gynecological examination (general) (routine) without abnormal findings: Secondary | ICD-10-CM

## 2016-07-08 LAB — CBC WITH DIFFERENTIAL/PLATELET
Basophils Absolute: 61 cells/uL (ref 0–200)
Basophils Relative: 1 %
EOS ABS: 244 {cells}/uL (ref 15–500)
Eosinophils Relative: 4 %
HEMATOCRIT: 39.8 % (ref 35.0–45.0)
Hemoglobin: 13.3 g/dL (ref 11.7–15.5)
Lymphocytes Relative: 20 %
Lymphs Abs: 1220 cells/uL (ref 850–3900)
MCH: 31.9 pg (ref 27.0–33.0)
MCHC: 33.4 g/dL (ref 32.0–36.0)
MCV: 95.4 fL (ref 80.0–100.0)
MONO ABS: 549 {cells}/uL (ref 200–950)
MPV: 9.4 fL (ref 7.5–12.5)
Monocytes Relative: 9 %
NEUTROS ABS: 4026 {cells}/uL (ref 1500–7800)
Neutrophils Relative %: 66 %
PLATELETS: 229 10*3/uL (ref 140–400)
RBC: 4.17 MIL/uL (ref 3.80–5.10)
RDW: 12.8 % (ref 11.0–15.0)
WBC: 6.1 10*3/uL (ref 3.8–10.8)

## 2016-07-08 NOTE — Patient Instructions (Signed)
Followup in one year for annual exam, sooner if any issues 

## 2016-07-08 NOTE — Progress Notes (Signed)
    Deborah Terrell 1965-06-10 [id number]        51 y.o.  G2P2002 for annual exam.    Past medical history,surgical history, problem list, medications, allergies, family history and social history were all reviewed and documented as reviewed in the EPIC chart.  ROS:  Performed with pertinent positives and negatives included in the history, assessment and plan.   Additional significant findings :  None   Exam: [name] assistant Vitals:   07/08/16 1145  BP: 116/74  Weight: 150 lb (68 kg)  Height: 5\' 6"  (1.676 m)   Body mass index is 24.21 kg/m.  General appearance:  Normal affect, orientation and appearance. Skin: Grossly normal HEENT: Without gross lesions.  No cervical or supraclavicular adenopathy. Thyroid normal.  Lungs:  Clear without wheezing, rales or rhonchi Cardiac: RR, without RMG Abdominal:  Soft, nontender, without masses, guarding, rebound, organomegaly or hernia Breasts:  Examined lying and sitting without masses, retractions, discharge or axillary adenopathy. Pelvic:  Ext, BUS, Vagina: Normal  Cervix: Normal  Uterus: Anteverted, normal size, shape and contour, midline and mobile nontender   Adnexa: Without masses or tenderness    Anus and perineum: Normal   Rectovaginal: Normal sphincter tone without palpated masses or tenderness.    Assessment/Plan:  51 y.o. G31P2002 female for annual exam with regular menses, vasectomy birth control.   1. Pap smear/HPV 2015 negative. No Pap smear done today. No history of abnormal Pap smears. Plan repeat Pap smear approaching 5 year interval per current screening guidelines. 2. Mammography 11/2015. Continue with annual mammography when due. SBE monthly reviewed. Breast exam normal today. 3. Health maintenance. Baseline CBC and CMP done today. Liver profile normal last year. Follow up in one year, sooner as needed.   Anastasio Auerbach MD, 12:12 PM 07/08/2016

## 2016-07-09 LAB — COMPREHENSIVE METABOLIC PANEL
ALT: 17 U/L (ref 6–29)
AST: 16 U/L (ref 10–35)
Albumin: 4.2 g/dL (ref 3.6–5.1)
Alkaline Phosphatase: 54 U/L (ref 33–115)
BUN: 15 mg/dL (ref 7–25)
CALCIUM: 9.8 mg/dL (ref 8.6–10.2)
CHLORIDE: 103 mmol/L (ref 98–110)
CO2: 22 mmol/L (ref 20–31)
Creat: 0.81 mg/dL (ref 0.50–1.10)
GLUCOSE: 89 mg/dL (ref 65–99)
POTASSIUM: 4.2 mmol/L (ref 3.5–5.3)
Sodium: 136 mmol/L (ref 135–146)
Total Bilirubin: 0.5 mg/dL (ref 0.2–1.2)
Total Protein: 6.8 g/dL (ref 6.1–8.1)

## 2017-01-25 ENCOUNTER — Other Ambulatory Visit: Payer: Self-pay | Admitting: Gynecology

## 2017-01-25 DIAGNOSIS — Z1239 Encounter for other screening for malignant neoplasm of breast: Secondary | ICD-10-CM

## 2017-01-27 ENCOUNTER — Ambulatory Visit (INDEPENDENT_AMBULATORY_CARE_PROVIDER_SITE_OTHER): Payer: BC Managed Care – PPO

## 2017-01-27 DIAGNOSIS — Z1239 Encounter for other screening for malignant neoplasm of breast: Secondary | ICD-10-CM

## 2017-01-27 DIAGNOSIS — Z1231 Encounter for screening mammogram for malignant neoplasm of breast: Secondary | ICD-10-CM

## 2017-08-09 ENCOUNTER — Ambulatory Visit: Payer: BC Managed Care – PPO | Admitting: Gynecology

## 2017-08-09 ENCOUNTER — Other Ambulatory Visit: Payer: Self-pay

## 2017-08-09 ENCOUNTER — Encounter: Payer: Self-pay | Admitting: Gynecology

## 2017-08-09 VITALS — BP 122/80 | Ht 66.0 in | Wt 148.0 lb

## 2017-08-09 DIAGNOSIS — N926 Irregular menstruation, unspecified: Secondary | ICD-10-CM | POA: Diagnosis not present

## 2017-08-09 DIAGNOSIS — Z13220 Encounter for screening for lipoid disorders: Secondary | ICD-10-CM | POA: Diagnosis not present

## 2017-08-09 DIAGNOSIS — Z01419 Encounter for gynecological examination (general) (routine) without abnormal findings: Secondary | ICD-10-CM

## 2017-08-09 DIAGNOSIS — N951 Menopausal and female climacteric states: Secondary | ICD-10-CM

## 2017-08-09 DIAGNOSIS — R7309 Other abnormal glucose: Secondary | ICD-10-CM

## 2017-08-09 DIAGNOSIS — D708 Other neutropenia: Secondary | ICD-10-CM

## 2017-08-09 NOTE — Progress Notes (Signed)
lab

## 2017-08-09 NOTE — Progress Notes (Signed)
    Deborah Terrell 1965-02-13 [id number]        52 y.o.  G2P2002 for annual gynecologic exam.  Patient notes over the past year her menses have become a little more irregular where they will come a week earlier a week later each month.  Also starting to have more emotional swings and some hot flushes.  No weight gain or weight loss.  No skin or hair changes.  Vasectomy birth control.  Past medical history,surgical history, problem list, medications, allergies, family history and social history were all reviewed and documented as reviewed in the EPIC chart.  ROS:  Performed with pertinent positives and negatives included in the history, assessment and plan.   Additional significant findings : None   Exam: Wandra Scot assistant Vitals:   08/09/17 0826  BP: 122/80  Weight: 148 lb (67.1 kg)  Height: 5\' 6"  (1.676 m)   Body mass index is 23.89 kg/m.  General appearance:  Normal affect, orientation and appearance. Skin: Grossly normal HEENT: Without gross lesions.  No cervical or supraclavicular adenopathy. Thyroid normal.  Lungs:  Clear without wheezing, rales or rhonchi Cardiac: RR, without RMG Abdominal:  Soft, nontender, without masses, guarding, rebound, organomegaly or hernia Breasts:  Examined lying and sitting without masses, retractions, discharge or axillary adenopathy. Pelvic:  Ext, BUS, Vagina: Normal  Cervix: Normal.  Pap smear/HPV  Uterus: Anteverted, normal size, shape and contour, midline and mobile nontender   Adnexa: Without masses or tenderness    Anus and perineum: Normal   Rectovaginal: Normal sphincter tone without palpated masses or tenderness.    Assessment/Plan:  52 y.o. G37P2002 female for annual gynecologic exam, vasectomy birth control.   1. Perimenopause.  Patient having perimenopausal symptoms with some mild menstrual irregularity, some hot flashes and emotional swings.  We discussed what to expect in the perimenopause.  Options for management were  reviewed to include hormonal manipulation, anxiolytic/antidepressant, expectant.  Will check baseline FSH and TSH given the menstrual irregularity and the overall symptoms to rule out thyroid dysfunction.  Patient will monitor for now and will follow-up if her symptoms worsen and wants to rediscuss treatment options. 2. Pap smear/HPV 2015.  Pap smear/HPV today at a 4-1/2-year interval.  No history of abnormal Pap smears.  Plan to continue Pap smear/HPV at 5-year intervals per current screening guidelines. 3. Mammography 01/2017.  Continue with annual mammography next year when due.  Breast exam normal today. 4. Health maintenance.  Baseline CBC, CMP and lipid profile ordered along with above blood work.  Follow-up for lab results.  Follow-up in 1 year for annual exam.   Anastasio Auerbach MD, 8:59 AM 08/09/2017

## 2017-08-09 NOTE — Patient Instructions (Signed)
Follow-up for lab test results  Follow-up in 1 year for annual exam.

## 2017-08-10 LAB — FOLLICLE STIMULATING HORMONE: FSH: 7.5 m[IU]/mL

## 2017-08-10 LAB — LIPID PANEL
CHOL/HDL RATIO: 3.4 (calc) (ref <5.0)
Cholesterol: 182 mg/dL (ref <200)
HDL: 54 mg/dL (ref >50)
LDL Cholesterol (Calc): 105 mg/dL (calc) — ABNORMAL HIGH
NON-HDL CHOLESTEROL (CALC): 128 mg/dL (ref <130)
Triglycerides: 132 mg/dL (ref <150)

## 2017-08-10 LAB — COMPREHENSIVE METABOLIC PANEL
AG Ratio: 1.9 (calc) (ref 1.0–2.5)
ALBUMIN MSPROF: 4.3 g/dL (ref 3.6–5.1)
ALT: 10 U/L (ref 6–29)
AST: 12 U/L (ref 10–35)
Alkaline phosphatase (APISO): 48 U/L (ref 33–130)
BUN: 17 mg/dL (ref 7–25)
CHLORIDE: 106 mmol/L (ref 98–110)
CO2: 26 mmol/L (ref 20–32)
CREATININE: 0.97 mg/dL (ref 0.50–1.05)
Calcium: 9.5 mg/dL (ref 8.6–10.4)
Globulin: 2.3 g/dL (calc) (ref 1.9–3.7)
Glucose, Bld: 103 mg/dL — ABNORMAL HIGH (ref 65–99)
POTASSIUM: 4 mmol/L (ref 3.5–5.3)
Sodium: 138 mmol/L (ref 135–146)
Total Bilirubin: 0.4 mg/dL (ref 0.2–1.2)
Total Protein: 6.6 g/dL (ref 6.1–8.1)

## 2017-08-10 LAB — CBC WITH DIFFERENTIAL/PLATELET
BASOS PCT: 0.8 %
Basophils Absolute: 30 cells/uL (ref 0–200)
Eosinophils Absolute: 141 cells/uL (ref 15–500)
Eosinophils Relative: 3.8 %
HCT: 34.9 % — ABNORMAL LOW (ref 35.0–45.0)
HEMOGLOBIN: 12 g/dL (ref 11.7–15.5)
Lymphs Abs: 685 cells/uL — ABNORMAL LOW (ref 850–3900)
MCH: 32.3 pg (ref 27.0–33.0)
MCHC: 34.4 g/dL (ref 32.0–36.0)
MCV: 94.1 fL (ref 80.0–100.0)
MPV: 10.1 fL (ref 7.5–12.5)
Monocytes Relative: 7.8 %
NEUTROS ABS: 2557 {cells}/uL (ref 1500–7800)
Neutrophils Relative %: 69.1 %
PLATELETS: 245 10*3/uL (ref 140–400)
RBC: 3.71 10*6/uL — AB (ref 3.80–5.10)
RDW: 11.8 % (ref 11.0–15.0)
Total Lymphocyte: 18.5 %
WBC: 3.7 10*3/uL — AB (ref 3.8–10.8)
WBCMIX: 289 {cells}/uL (ref 200–950)

## 2017-08-10 LAB — PAP, TP IMAGING W/ HPV RNA, RFLX HPV TYPE 16,18/45: HPV DNA HIGH RISK: NOT DETECTED

## 2017-08-10 LAB — TSH: TSH: 2.78 mIU/L

## 2017-08-17 ENCOUNTER — Encounter: Payer: Self-pay | Admitting: Gynecology

## 2018-01-20 ENCOUNTER — Other Ambulatory Visit: Payer: Self-pay

## 2018-01-20 ENCOUNTER — Emergency Department (HOSPITAL_BASED_OUTPATIENT_CLINIC_OR_DEPARTMENT_OTHER)
Admission: EM | Admit: 2018-01-20 | Discharge: 2018-01-21 | Disposition: A | Discharge status: Home / Self Care | Payer: BC Managed Care – PPO | Attending: Emergency Medicine | Admitting: Emergency Medicine

## 2018-01-20 ENCOUNTER — Encounter (HOSPITAL_BASED_OUTPATIENT_CLINIC_OR_DEPARTMENT_OTHER): Payer: Self-pay | Admitting: *Deleted

## 2018-01-20 DIAGNOSIS — S71152A Open bite, left thigh, initial encounter: Secondary | ICD-10-CM | POA: Diagnosis present

## 2018-01-20 DIAGNOSIS — Z23 Encounter for immunization: Secondary | ICD-10-CM | POA: Diagnosis not present

## 2018-01-20 DIAGNOSIS — Y9389 Activity, other specified: Secondary | ICD-10-CM | POA: Diagnosis not present

## 2018-01-20 DIAGNOSIS — E119 Type 2 diabetes mellitus without complications: Secondary | ICD-10-CM | POA: Insufficient documentation

## 2018-01-20 DIAGNOSIS — Y9289 Other specified places as the place of occurrence of the external cause: Secondary | ICD-10-CM | POA: Diagnosis not present

## 2018-01-20 DIAGNOSIS — W5581XA Bitten by other mammals, initial encounter: Secondary | ICD-10-CM | POA: Diagnosis not present

## 2018-01-20 DIAGNOSIS — Y998 Other external cause status: Secondary | ICD-10-CM | POA: Diagnosis not present

## 2018-01-20 DIAGNOSIS — T148XXA Other injury of unspecified body region, initial encounter: Secondary | ICD-10-CM

## 2018-01-20 MED ORDER — RABIES IMMUNE GLOBULIN 150 UNIT/ML IM INJ
20.0000 [IU]/kg | INJECTION | Freq: Once | INTRAMUSCULAR | Status: AC
  Administered 2018-01-21: 1350 [IU] via INTRAMUSCULAR
  Filled 2018-01-20: qty 10

## 2018-01-20 MED ORDER — TETANUS-DIPHTH-ACELL PERTUSSIS 5-2.5-18.5 LF-MCG/0.5 IM SUSP
0.5000 mL | Freq: Once | INTRAMUSCULAR | Status: AC
  Administered 2018-01-21: 0.5 mL via INTRAMUSCULAR
  Filled 2018-01-20: qty 0.5

## 2018-01-20 MED ORDER — RABIES VACCINE, PCEC IM SUSR
1.0000 mL | Freq: Once | INTRAMUSCULAR | Status: AC
  Administered 2018-01-21: 1 mL via INTRAMUSCULAR
  Filled 2018-01-20: qty 1

## 2018-01-20 NOTE — ED Notes (Signed)
Pt was bitten by a fox this evening and animal control has the animal in their custody.

## 2018-01-20 NOTE — Discharge Instructions (Signed)
°                                  RABIES VACCINE FOLLOW UP  Patient's Name: Deborah Terrell                     Original Order Date:01/20/2018  Medical Record Number: [id number]  ED Physician: Dorie Rank, MD Primary Diagnosis: Rabies Exposure       PCP: Patient, No Pcp Per  Patient Phone Number: (home) [phone] (home)    (cell)  Telephone Information:  Mobile [phone]    (work) [phone] (work) Species of Animal:     You have been seen in the Emergency Department for a possible rabies exposure. It's very important you return for the additional vaccine doses.  Please call the clinic listed below for hours of operation.   Clinic that will administer your rabies vaccines:    DAY 0:  01/20/2018      DAY 3:  01/23/2018       DAY 7:  01/27/2018     DAY 14:  02/03/2018         The 5th vaccine injection is considered for immune compromised patients only.  DAY 28:  02/17/2018

## 2018-01-20 NOTE — ED Triage Notes (Signed)
Fox bite to her left upper leg tonight. Animal control has the fox.

## 2018-01-20 NOTE — ED Provider Notes (Signed)
[facility] EMERGENCY DEPARTMENT Provider Note   CSN: [id number] Arrival date & time: 01/20/18  2147     History   Chief Complaint Chief Complaint  Patient presents with  . Animal Bite    HPI Deborah Terrell is a 53 y.o. female.  HPI Patient presented to the emergency room for evaluation after getting bitten by a fox.  Patient states a fox jumped out and bit her left upper leg tonight.  The animal was very aggressive and acting bizarrely.  They were able to contain the fox and animal control now has the Fox in custody.  Patient was encouraged to come to the emergency room for treatment of probable rabies exposure. History reviewed. No pertinent past medical history.  Patient Active Problem List   Diagnosis Date Noted  . Diabetes mellitus     Past Surgical History:  Procedure Laterality Date  . BREAST BIOPSY Left   . BREAST SURGERY     Biopsy benign  . CESAREAN SECTION       OB History    Gravida  2   Para  2   Term  2   Preterm      AB  0   Living  2     SAB      TAB      Ectopic  0   Multiple      Live Births  2            Home Medications    Prior to Admission medications   Medication Sig Start Date End Date Taking? Authorizing Provider  Multiple Vitamin (MULTIVITAMIN) capsule Take 1 capsule by mouth daily.     Yes [provider]    Family History Family History  Problem Relation Age of Onset  . Diabetes Father   . Hypertension Maternal Grandmother   . Breast cancer Maternal Aunt 6  . Diabetes Mother     Social History Social History   Tobacco Use  . Smoking status: Never Smoker  . Smokeless tobacco: Never Used  Substance Use Topics  . Alcohol use: Yes    Alcohol/week: 0.0 standard drinks    Comment: Rare  . Drug use: No     Allergies   Patient has no known allergies.   Review of Systems Review of Systems  All other systems reviewed and are negative.    Physical Exam Updated Vital  Signs BP 136/85 (BP Location: Left Arm)   Pulse (!) 105   Temp 98.8 F (37.1 C) (Oral)   Resp 18   Ht 1.676 m (5\' 6" )   Wt 65.8 kg   LMP 12/28/2017   SpO2 100%   BMI 23.40 kg/m   Physical Exam Vitals signs and nursing note reviewed.  Constitutional:      General: She is not in acute distress.    Appearance: She is well-developed.  HENT:     Head: Normocephalic and atraumatic.     Right Ear: External ear normal.     Left Ear: External ear normal.  Eyes:     General: No scleral icterus.       Right eye: No discharge.        Left eye: No discharge.     Conjunctiva/sclera: Conjunctivae normal.  Neck:     Musculoskeletal: Neck supple.     Trachea: No tracheal deviation.  Cardiovascular:     Rate and Rhythm: Normal rate.  Pulmonary:     Effort: Pulmonary effort is normal.  No respiratory distress.     Breath sounds: No stridor.  Abdominal:     General: There is no distension.  Musculoskeletal:        General: No swelling or deformity.     Comments: Superficial wound that does break the skin in the left lateral upper leg proximal to the knee, patient has a small amount of dried blood   Skin:    General: Skin is warm and dry.     Findings: No rash.  Neurological:     Mental Status: She is alert.     Cranial Nerves: Cranial nerve deficit: no gross deficits.      ED Treatments / Results    Procedures Procedures (including critical care time)  Medications Ordered in ED Medications  rabies immune globulin (HYPERAB/KEDRAB) injection 1,350 Units (has no administration in time range)  rabies vaccine (RABAVERT) injection 1 mL (has no administration in time range)  Tdap (BOOSTRIX) injection 0.5 mL (has no administration in time range)     Initial Impression / Assessment and Plan / ED Course  I have reviewed the triage vital signs and the nursing notes.  Pertinent labs & imaging results that were available during my care of the patient were reviewed by me and  considered in my medical decision making (see chart for details).   Patient's wound is small and does not require any suturing.  We will irrigate the wound.  Patient will require tetanus and rabies prophylaxis.  We will initiate the series this evening.   Final Clinical Impressions(s) / ED Diagnoses   Final diagnoses:  Animal bite  Need for rabies vaccination    ED Discharge Orders    None       Dorie Rank, MD 01/20/18 2334

## 2018-01-21 NOTE — ED Notes (Signed)
Patient verbalizes understanding of discharge instructions. Opportunity for questioning and answers were provided. Armband removed by staff, pt discharged from ED home via POV with family. 

## 2018-01-23 ENCOUNTER — Emergency Department
Admission: EM | Admit: 2018-01-23 | Discharge: 2018-01-23 | Disposition: A | Discharge status: Home / Self Care | Payer: BC Managed Care – PPO | Source: Home / Self Care

## 2018-01-23 ENCOUNTER — Other Ambulatory Visit: Payer: Self-pay

## 2018-01-23 DIAGNOSIS — Z203 Contact with and (suspected) exposure to rabies: Secondary | ICD-10-CM

## 2018-01-23 DIAGNOSIS — Z23 Encounter for immunization: Secondary | ICD-10-CM

## 2018-01-23 MED ORDER — RABIES VACCINE, PCEC IM SUSR
1.0000 mL | Freq: Once | INTRAMUSCULAR | Status: AC
  Administered 2018-01-23: 1 mL via INTRAMUSCULAR

## 2018-01-23 NOTE — ED Triage Notes (Signed)
Pt presents for Rabies injection. Education provided. Pt had no other questions/concerns. Due to return on 01/27/2018 for next injection.

## 2018-01-23 NOTE — ED Provider Notes (Signed)
[facility]    CSN: [id number] Arrival date & time: 01/23/18  1535     History   Chief Complaint Chief Complaint  Patient presents with  . Rabies Injection    Fox    HPI Deborah Terrell is a 53 y.o. female.   Patient presents for 2nd rabies vaccination, having been treated for animal bite at [facility] ED on 01/20/18.  She has no complaints  The history is provided by the patient.    History reviewed. No pertinent past medical history.  Patient Active Problem List   Diagnosis Date Noted  . Diabetes mellitus     Past Surgical History:  Procedure Laterality Date  . BREAST BIOPSY Left   . BREAST SURGERY     Biopsy benign  . CESAREAN SECTION      OB History    Gravida  2   Para  2   Term  2   Preterm      AB  0   Living  2     SAB      TAB      Ectopic  0   Multiple      Live Births  2            Home Medications    Prior to Admission medications   Medication Sig Start Date End Date Taking? Authorizing Provider  Multiple Vitamin (MULTIVITAMIN) capsule Take 1 capsule by mouth daily.      [provider]    Family History Family History  Problem Relation Age of Onset  . Diabetes Father   . Hypertension Maternal Grandmother   . Breast cancer Maternal Aunt 72  . Diabetes Mother     Social History Social History   Tobacco Use  . Smoking status: Never Smoker  . Smokeless tobacco: Never Used  Substance Use Topics  . Alcohol use: Yes    Alcohol/week: 0.0 standard drinks    Comment: Rare  . Drug use: No     Allergies   Patient has no known allergies.   Review of Systems Review of Systems No complaints  Physical Exam Triage Vital Signs ED Triage Vitals  Enc Vitals Group     BP      Pulse      Resp      Temp      Temp src      SpO2      Weight      Height      Head Circumference      Peak Flow      Pain Score      Pain Loc      Pain Edu?      Excl. in [place]?    No data  found.  Updated Vital Signs BP 133/66 (BP Location: Right Arm)   Pulse 77   Temp 98.5 F (36.9 C) (Oral)   Ht 5\' 6"  (1.676 m)   Wt 67.6 kg   LMP 01/23/2018 (Approximate)   SpO2 98%   BMI 24.05 kg/m   Visual Acuity Right Eye Distance:   Left Eye Distance:   Bilateral Distance:    Right Eye Near:   Left Eye Near:    Bilateral Near:     Physical Exam Patient not examined  UC Treatments / Results  Labs (all labs ordered are listed, but only abnormal results are displayed) Labs Reviewed - No data to display  EKG None  Radiology No results found.  Procedures Procedures (including critical care time)  Medications Ordered in UC Medications  rabies vaccine (RABAVERT) injection 1 mL (1 mL Intramuscular Given 01/23/18 1608)    Initial Impression / Assessment and Plan / UC Course  I have reviewed the triage vital signs and the nursing notes.  Pertinent labs & imaging results that were available during my care of the patient were reviewed by me and considered in my medical decision making (see chart for details).    Rabavert administered. Return 01/27/18 for next immunization  Final Clinical Impressions(s) / UC Diagnoses   Final diagnoses:  Need for immunization against rabies   Discharge Instructions   None    ED Prescriptions    None        Kandra Nicolas, MD 01/25/18 2001

## 2018-01-25 ENCOUNTER — Other Ambulatory Visit: Payer: Self-pay | Admitting: Gynecology

## 2018-01-25 DIAGNOSIS — Z1231 Encounter for screening mammogram for malignant neoplasm of breast: Secondary | ICD-10-CM

## 2018-01-27 ENCOUNTER — Emergency Department
Admission: EM | Admit: 2018-01-27 | Discharge: 2018-01-27 | Disposition: A | Discharge status: Home / Self Care | Payer: BC Managed Care – PPO | Source: Home / Self Care

## 2018-01-27 DIAGNOSIS — Z203 Contact with and (suspected) exposure to rabies: Secondary | ICD-10-CM

## 2018-01-27 MED ORDER — RABIES VACCINE, PCEC IM SUSR
1.0000 mL | Freq: Once | INTRAMUSCULAR | Status: AC
  Administered 2018-01-27: 1 mL via INTRAMUSCULAR

## 2018-01-27 NOTE — ED Triage Notes (Signed)
Pt here for next rabies injection.

## 2018-02-01 ENCOUNTER — Ambulatory Visit (INDEPENDENT_AMBULATORY_CARE_PROVIDER_SITE_OTHER): Payer: BC Managed Care – PPO

## 2018-02-01 DIAGNOSIS — Z1231 Encounter for screening mammogram for malignant neoplasm of breast: Secondary | ICD-10-CM

## 2018-02-03 ENCOUNTER — Emergency Department
Admission: EM | Admit: 2018-02-03 | Discharge: 2018-02-03 | Disposition: A | Discharge status: Home / Self Care | Payer: BC Managed Care – PPO | Source: Home / Self Care

## 2018-02-03 ENCOUNTER — Other Ambulatory Visit: Payer: Self-pay

## 2018-02-03 DIAGNOSIS — Z203 Contact with and (suspected) exposure to rabies: Secondary | ICD-10-CM

## 2018-02-03 MED ORDER — RABIES VACCINE, PCEC IM SUSR
1.0000 mL | Freq: Once | INTRAMUSCULAR | Status: AC
  Administered 2018-02-03: 1 mL via INTRAMUSCULAR

## 2018-02-03 NOTE — ED Triage Notes (Signed)
Pt is here for Day 14 Rabies vaccine injection which completes today. VS wdl. No concerns at this time.

## 2018-02-08 DIAGNOSIS — Z0289 Encounter for other administrative examinations: Secondary | ICD-10-CM

## 2018-08-11 ENCOUNTER — Other Ambulatory Visit: Payer: Self-pay

## 2018-08-11 ENCOUNTER — Encounter: Payer: BC Managed Care – PPO | Admitting: Gynecology

## 2018-08-14 ENCOUNTER — Ambulatory Visit (INDEPENDENT_AMBULATORY_CARE_PROVIDER_SITE_OTHER): Payer: BC Managed Care – PPO | Admitting: Gynecology

## 2018-08-14 ENCOUNTER — Encounter: Payer: Self-pay | Admitting: Gynecology

## 2018-08-14 ENCOUNTER — Other Ambulatory Visit: Payer: Self-pay

## 2018-08-14 VITALS — BP 124/76 | Ht 66.0 in | Wt 148.0 lb

## 2018-08-14 DIAGNOSIS — Z13220 Encounter for screening for lipoid disorders: Secondary | ICD-10-CM

## 2018-08-14 DIAGNOSIS — Z01419 Encounter for gynecological examination (general) (routine) without abnormal findings: Secondary | ICD-10-CM | POA: Diagnosis not present

## 2018-08-14 DIAGNOSIS — N951 Menopausal and female climacteric states: Secondary | ICD-10-CM

## 2018-08-14 NOTE — Patient Instructions (Signed)
Schedule your colonoscopy with either:  [facility]   Address: [address], [place], [place] [address]  [phone]    or  [facility]  Address: [address], , [place] [address]  [phone]      

## 2018-08-14 NOTE — Progress Notes (Signed)
    Deborah Terrell August 14, 1965 [id number]        52 y.o.  G2P2002 for annual gynecologic exam.  Continues with monthly menses.  They are getting a little closer about every 3 weeks.  No prolonged or atypical bleeding in between.  No significant menopausal symptoms otherwise.  Past medical history,surgical history, problem list, medications, allergies, family history and social history were all reviewed and documented as reviewed in the EPIC chart.  ROS:  Performed with pertinent positives and negatives included in the history, assessment and plan.   Additional significant findings : None   Exam: [name] assistant Vitals:   08/14/18 0806  BP: 124/76  Weight: 148 lb (67.1 kg)  Height: 5\' 6"  (1.676 m)   Body mass index is 23.89 kg/m.  General appearance:  Normal affect, orientation and appearance. Skin: Grossly normal HEENT: Without gross lesions.  No cervical or supraclavicular adenopathy. Thyroid normal.  Lungs:  Clear without wheezing, rales or rhonchi Cardiac: RR, without RMG Abdominal:  Soft, nontender, without masses, guarding, rebound, organomegaly or hernia Breasts:  Examined lying and sitting without masses, retractions, discharge or axillary adenopathy. Pelvic:  Ext, BUS, Vagina: Normal  Cervix: Normal  Uterus: Anteverted, normal size, shape and contour, midline and mobile nontender   Adnexa: Without masses or tenderness    Anus and perineum: Normal   Rectovaginal: Normal sphincter tone without palpated masses or tenderness.    Assessment/Plan:  53 y.o. G62P2002 female for annual gynecologic exam.  With regular menses, vasectomy birth control  1. Perimenopausal.  Continues with menses getting a little closer together.  We discussed what to expect in the perimenopause.  She is not having significant menopausal symptoms.  Will monitor and report if significant symptoms develop or atypical bleeding. 2. Pap smear/HPV 2019.  No Pap smear done today.  No history of  abnormal Pap smears.  Plan repeat Pap smear/HPV at 5-year interval per current screening guidelines. 3. Mammography 01/2018.  Continue with annual mammography when due.  Breast exam normal today. 4. Colonoscopy never.  Recommended scheduling a screening colonoscopy as she is over 50 and she agrees to arrange.  Names and numbers provided. 5. Health maintenance.  Baseline CBC, CMP, lipid profile and TSH ordered.  Follow-up 1 year, sooner as needed.   Anastasio Auerbach MD, 8:29 AM 08/14/2018

## 2018-08-15 ENCOUNTER — Other Ambulatory Visit: Payer: Self-pay | Admitting: Gynecology

## 2018-08-15 DIAGNOSIS — E7800 Pure hypercholesterolemia, unspecified: Secondary | ICD-10-CM

## 2018-08-15 LAB — LIPID PANEL
Cholesterol: 212 mg/dL — ABNORMAL HIGH (ref <200)
HDL: 60 mg/dL (ref >=50)
LDL Cholesterol (Calc): 124 mg/dL (calc) — ABNORMAL HIGH
Non-HDL Cholesterol (Calc): 152 mg/dL (calc) — ABNORMAL HIGH (ref <130)
Total CHOL/HDL Ratio: 3.5 (calc) (ref <5.0)
Triglycerides: 160 mg/dL — ABNORMAL HIGH (ref <150)

## 2018-08-15 LAB — CBC WITH DIFFERENTIAL/PLATELET
Absolute Monocytes: 311 cells/uL (ref 200–950)
Basophils Absolute: 48 cells/uL (ref 0–200)
Basophils Relative: 1.3 %
Eosinophils Absolute: 170 cells/uL (ref 15–500)
Eosinophils Relative: 4.6 %
HCT: 39.4 % (ref 35.0–45.0)
Hemoglobin: 13.4 g/dL (ref 11.7–15.5)
Lymphs Abs: 918 cells/uL (ref 850–3900)
MCH: 32.2 pg (ref 27.0–33.0)
MCHC: 34 g/dL (ref 32.0–36.0)
MCV: 94.7 fL (ref 80.0–100.0)
MPV: 10.4 fL (ref 7.5–12.5)
Monocytes Relative: 8.4 %
Neutro Abs: 2253 cells/uL (ref 1500–7800)
Neutrophils Relative %: 60.9 %
Platelets: 244 10*3/uL (ref 140–400)
RBC: 4.16 10*6/uL (ref 3.80–5.10)
RDW: 11.5 % (ref 11.0–15.0)
Total Lymphocyte: 24.8 %
WBC: 3.7 10*3/uL — ABNORMAL LOW (ref 3.8–10.8)

## 2018-08-15 LAB — COMPREHENSIVE METABOLIC PANEL
AG Ratio: 1.6 (calc) (ref 1.0–2.5)
ALT: 18 U/L (ref 6–29)
AST: 17 U/L (ref 10–35)
Albumin: 4.4 g/dL (ref 3.6–5.1)
Alkaline phosphatase (APISO): 49 U/L (ref 37–153)
BUN: 11 mg/dL (ref 7–25)
CO2: 24 mmol/L (ref 20–32)
Calcium: 10 mg/dL (ref 8.6–10.4)
Chloride: 106 mmol/L (ref 98–110)
Creat: 0.83 mg/dL (ref 0.50–1.05)
Globulin: 2.7 g/dL (calc) (ref 1.9–3.7)
Glucose, Bld: 91 mg/dL (ref 65–99)
Potassium: 4.2 mmol/L (ref 3.5–5.3)
Sodium: 139 mmol/L (ref 135–146)
Total Bilirubin: 0.5 mg/dL (ref 0.2–1.2)
Total Protein: 7.1 g/dL (ref 6.1–8.1)

## 2018-08-15 LAB — TSH: TSH: 2.9 mIU/L

## 2018-08-29 ENCOUNTER — Encounter: Payer: Self-pay | Admitting: Gynecology

## 2018-08-30 ENCOUNTER — Other Ambulatory Visit: Payer: Self-pay

## 2018-10-04 ENCOUNTER — Encounter: Payer: Self-pay | Admitting: Gynecology

## 2019-01-11 ENCOUNTER — Other Ambulatory Visit: Payer: Self-pay

## 2019-01-11 ENCOUNTER — Ambulatory Visit: Payer: BC Managed Care – PPO | Admitting: Plastic Surgery

## 2019-01-11 ENCOUNTER — Encounter: Payer: Self-pay | Admitting: Plastic Surgery

## 2019-01-11 VITALS — BP 130/86 | HR 87 | Temp 98.0°F | Ht 66.0 in | Wt 156.2 lb

## 2019-01-11 DIAGNOSIS — D0360 Melanoma in situ of unspecified upper limb, including shoulder: Secondary | ICD-10-CM

## 2019-01-11 NOTE — Progress Notes (Signed)
   Referring Provider Jari Pigg, MD [place], [address] ,  [place]   CC:  Chief Complaint  Patient presents with  . 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 female.  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  These were diagnosed by shave biopsy by her dermatologist.  She has had atypical nevi excised before.  They need excision with margins.  She also has 3 lesions on her face that bother her.  They are not painful and not changing in size one is on her cheek one is on her chin and the other on the left side of her neck.  No Known Allergies  Outpatient Encounter Medications as of 01/11/2019  Medication Sig  . Multiple Vitamin (MULTIVITAMIN) capsule Take 1 capsule by mouth daily.     No facility-administered encounter medications on file as of 01/11/2019.     No past medical history on file. Negative Past Surgical History:  Procedure Laterality Date  . BREAST BIOPSY Left   . BREAST SURGERY     Biopsy benign  . CESAREAN SECTION      Family History  Problem Relation Age of Onset  . Diabetes Father   . Hypertension Maternal Grandmother   . Breast cancer Maternal Aunt 12  . Diabetes Mother     Social History   Social History Narrative  . Not on file  Denies tobacco use  Review of Systems General: Denies fevers, chills, weight loss CV: Denies chest pain, shortness of breath, palpitations  Physical Exam Vitals with BMI 01/11/2019 08/14/2018 02/03/2018  Height 5\' 6"  5\' 6"  5\' 6"   Weight 156 lbs 3 oz 148 lbs 149 lbs  BMI 25.22 [id number]  Systolic [id number]  Diastolic 86 76 85  Pulse 87 - -    General:  No acute distress,  Alert and oriented, Non-Toxic, Normal speech and affect Examination of the right forearm shows a shave biopsy site ulnarly at about a centimeter in size.   Similarly on the abdomen there is about a centimeter size shave biopsy site that is clearly evident.  On her face there is a pedunculated benign-appearing lesion on her left neck that is about 2 mm in size.  There is a similar-appearing lesion on her chin at about 4 mm in size.  And there is a bit larger lesion on her left cheek closer to a centimeter in diameter.  These are all flesh-colored soft benign-appearing lesions.  Assessment/Plan Patient presents with a number of skin lesions that warrant excision.  Melanoma in situ and atypical nevus of the abdomen need to be excised with margins and sent for pathology.  The others on her face would also benefit from excision but this may be more of a cosmetic procedure.  I went through the risk of lesion excision with her that include bleeding, infection, damage to surrounding structures, need for additional procedures.  She is fully understanding and will try to get this scheduled as a local office procedure.  Cindra Presume 01/11/2019, 5:46 PM

## 2019-01-29 ENCOUNTER — Encounter: Payer: Self-pay | Admitting: Plastic Surgery

## 2019-02-01 ENCOUNTER — Other Ambulatory Visit: Payer: Self-pay

## 2019-02-01 ENCOUNTER — Other Ambulatory Visit (HOSPITAL_COMMUNITY)
Admission: RE | Admit: 2019-02-01 | Discharge: 2019-02-01 | Disposition: A | Discharge status: Home / Self Care | Payer: BC Managed Care – PPO | Source: Ambulatory Visit | Attending: Plastic Surgery | Admitting: Plastic Surgery

## 2019-02-01 ENCOUNTER — Ambulatory Visit (INDEPENDENT_AMBULATORY_CARE_PROVIDER_SITE_OTHER): Payer: BC Managed Care – PPO | Admitting: Plastic Surgery

## 2019-02-01 ENCOUNTER — Encounter: Payer: Self-pay | Admitting: Plastic Surgery

## 2019-02-01 VITALS — BP 138/78 | HR 116 | Temp 98.7°F | Ht 66.0 in | Wt 156.2 lb

## 2019-02-01 DIAGNOSIS — D0360 Melanoma in situ of unspecified upper limb, including shoulder: Secondary | ICD-10-CM

## 2019-02-01 NOTE — Progress Notes (Signed)
Operative Note   DATE OF OPERATION: 02/01/2019  LOCATION:    SURGICAL DEPARTMENT: Plastic Surgery  PREOPERATIVE DIAGNOSES: 1.  Right ulnar forearm melanoma in situ 2.  Right abdomen severely atypical nevus 3.  3 benign-appearing lesions of left cheek left chin and left neck  POSTOPERATIVE DIAGNOSES:  same  PROCEDURE:  1. Excision of right ulnar forearm melanoma in situ measuring 3.5 cm 2. Complex closure measuring right ulnar forearm 3.5 cm 3. Excision right abdomen severely atypical nevus 4 cm 4. Complex closure right abdomen 4 cm 5. Excision and complex closure of 3 benign-appearing lesions in the left cheek left chin and left neck.  Each excision was 2 cm in length with a complex closure.  SURGEON: Talmadge Coventry, MD  ANESTHESIA:  Local  COMPLICATIONS: None.   INDICATIONS FOR PROCEDURE:  The patient, Deborah Terrell is a 54 y.o. female born on 1965/05/09, is here for treatment of multiple skin lesions. MRN: [id number]  CONSENT:  Informed consent was obtained directly from the patient. Risks, benefits and alternatives were fully discussed. Specific risks including but not limited to bleeding, infection, hematoma, seroma, scarring, pain, infection, wound healing problems, and need for further surgery were all discussed. The patient did have an ample opportunity to have questions answered to satisfaction.   DESCRIPTION OF PROCEDURE:  Local anesthesia was administered. The patient's operative site was prepped and draped in a sterile fashion. A time out was performed and all information was confirmed to be correct.  The right forearm lesion was excised with a 15 blade taking at least 5 mm margins.  Hemostasis was obtained.  Circumferential undermining was performed and the skin was advanced and closed in layers with interrupted buried Monocryl sutures and running 5-0 Prolene for the skin.  The lesion excised measured 3.5 cm, and the total length of closure measured 3.5 cm.     The right abdomen lesion was excised with a 15 blade taking at least 5 mm margins.  Hemostasis was obtained.  Circumferential undermining was performed and skin was advanced and closed in layers with interrupted buried Monocryl sutures and running 5-0 Prolene for the skin.  The lesion excised measured 4 cm in length and the closure measured 4 cm as well.  The lesion of the left neck was a small skin tag and this was clipped at the base with scissors.  The left cheek lesion was excised in elliptical fashion.  The surrounding skin was undermined advanced and closed erupted buried Monocryl sutures and a running 5-0 plain gut.  The chin lesion was excised and closed in a similar fashion.  The patient tolerated the procedure well.  There were no complications.

## 2019-02-08 LAB — SURGICAL PATHOLOGY

## 2019-02-14 ENCOUNTER — Telehealth: Payer: Self-pay | Admitting: Plastic Surgery

## 2019-02-14 NOTE — Telephone Encounter (Signed)

## 2019-02-15 ENCOUNTER — Other Ambulatory Visit: Payer: Self-pay

## 2019-02-15 ENCOUNTER — Ambulatory Visit: Payer: BC Managed Care – PPO | Admitting: Surgical

## 2019-02-15 ENCOUNTER — Encounter: Payer: Self-pay | Admitting: Surgical

## 2019-02-15 ENCOUNTER — Ambulatory Visit (INDEPENDENT_AMBULATORY_CARE_PROVIDER_SITE_OTHER): Payer: BC Managed Care – PPO | Admitting: Surgical

## 2019-02-15 VITALS — BP 126/84 | HR 114 | Temp 97.7°F | Ht 66.0 in | Wt 155.6 lb

## 2019-02-15 DIAGNOSIS — L989 Disorder of the skin and subcutaneous tissue, unspecified: Secondary | ICD-10-CM

## 2019-02-15 DIAGNOSIS — D0360 Melanoma in situ of unspecified upper limb, including shoulder: Secondary | ICD-10-CM

## 2019-02-15 DIAGNOSIS — D239 Other benign neoplasm of skin, unspecified: Secondary | ICD-10-CM

## 2019-02-15 NOTE — Progress Notes (Signed)
Deborah Terrell is a 54 year old female here for follow-up after excision of right ulnar forearm melanoma in situ, excision of right abdomen severely atypical nevus, excision of benign appearing lesions of left cheek and left chin on 02/01/2019 with Dr. Silverio Lay pace.  Surgical pathology of right forearm lesion showed no residual melanoma in situ.  Right abdomen skin lesion excision showed no residual dysplastic nevus.  She reports that overall she has been doing well, she did notice that she had a little bit of irritation on the right arm and the abdomen.  She believes she also had a reaction to the bandage that she had placed over the right abdomen and right forearm.  It appears that she may have also had a slight reaction to the Prolene sutures.  Along both of these incisions, the length of the incision is red and irritated, does not appear infected, no drainage noted.  The periincisional area of the abdomen has a few maculopapular spots, she reports this is likely from the bandage she used as she noticed it after placing the bandage.  She is very pleased with her results.  Recommend wearing sunscreen to all incisions when exposed to the sun.  May also benefit from wearing out when exposed in the sun for extended periods of time.   No follow-up needed.  Call with any questions or concerns.  Call if right forearm or right abdomen incision erythema/irritation worsen.  May begin using Mederma or any scar cream on face and chin lesion incision but avoid on abdomen and right forearm for 1 more week due to irritation.

## 2019-02-26 ENCOUNTER — Other Ambulatory Visit: Payer: Self-pay

## 2019-02-26 ENCOUNTER — Other Ambulatory Visit: Payer: BC Managed Care – PPO

## 2019-02-26 DIAGNOSIS — E7800 Pure hypercholesterolemia, unspecified: Secondary | ICD-10-CM

## 2019-02-26 LAB — LIPID PANEL
Cholesterol: 198 mg/dL (ref <200)
HDL: 64 mg/dL (ref >=50)
LDL Cholesterol (Calc): 110 mg/dL (calc) — ABNORMAL HIGH
Non-HDL Cholesterol (Calc): 134 mg/dL (calc) — ABNORMAL HIGH (ref <130)
Total CHOL/HDL Ratio: 3.1 (calc) (ref <5.0)
Triglycerides: 126 mg/dL (ref <150)

## 2019-02-27 NOTE — Progress Notes (Signed)
Please let Deborah Terrell know her lipid panel results look good

## 2019-05-21 ENCOUNTER — Other Ambulatory Visit: Payer: Self-pay

## 2019-05-21 ENCOUNTER — Other Ambulatory Visit: Payer: Self-pay | Admitting: *Deleted

## 2019-05-21 ENCOUNTER — Other Ambulatory Visit: Payer: Self-pay | Admitting: Obstetrics & Gynecology

## 2019-05-21 DIAGNOSIS — Z1231 Encounter for screening mammogram for malignant neoplasm of breast: Secondary | ICD-10-CM

## 2019-05-24 ENCOUNTER — Ambulatory Visit (INDEPENDENT_AMBULATORY_CARE_PROVIDER_SITE_OTHER): Payer: BC Managed Care – PPO

## 2019-05-24 ENCOUNTER — Other Ambulatory Visit: Payer: Self-pay

## 2019-05-24 DIAGNOSIS — Z1231 Encounter for screening mammogram for malignant neoplasm of breast: Secondary | ICD-10-CM | POA: Diagnosis not present

## 2019-11-06 ENCOUNTER — Encounter: Payer: BC Managed Care – PPO | Admitting: Obstetrics and Gynecology

## 2019-11-08 ENCOUNTER — Other Ambulatory Visit: Payer: Self-pay

## 2019-11-08 ENCOUNTER — Encounter: Payer: Self-pay | Admitting: Nurse Practitioner

## 2019-11-08 ENCOUNTER — Ambulatory Visit: Payer: BC Managed Care – PPO | Admitting: Nurse Practitioner

## 2019-11-08 VITALS — BP 120/80 | Wt 153.0 lb

## 2019-11-08 DIAGNOSIS — Z01419 Encounter for gynecological examination (general) (routine) without abnormal findings: Secondary | ICD-10-CM | POA: Diagnosis not present

## 2019-11-08 DIAGNOSIS — Z23 Encounter for immunization: Secondary | ICD-10-CM

## 2019-11-08 DIAGNOSIS — N951 Menopausal and female climacteric states: Secondary | ICD-10-CM

## 2019-11-08 NOTE — Patient Instructions (Addendum)
Ginseng, Black Cohosh, or Cedar ADD Vitamin E Coconut oil-freeze in ice tray and insert vaginally   Health Maintenance, Female Adopting a healthy lifestyle and getting preventive care are important in promoting health and wellness. Ask your health care provider about:  The right schedule for you to have regular tests and exams.  Things you can do on your own to prevent diseases and keep yourself healthy. What should I know about diet, weight, and exercise? Eat a healthy diet   Eat a diet that includes plenty of vegetables, fruits, low-fat dairy products, and lean protein.  Do not eat a lot of foods that are high in solid fats, added sugars, or sodium. Maintain a healthy weight Body mass index (BMI) is used to identify weight problems. It estimates body fat based on height and weight. Your health care provider can help determine your BMI and help you achieve or maintain a healthy weight. Get regular exercise Get regular exercise. This is one of the most important things you can do for your health. Most adults should:  Exercise for at least 150 minutes each week. The exercise should increase your heart rate and make you sweat (moderate-intensity exercise).  Do strengthening exercises at least twice a week. This is in addition to the moderate-intensity exercise.  Spend less time sitting. Even light physical activity can be beneficial. Watch cholesterol and blood lipids Have your blood tested for lipids and cholesterol at 53 years of age, then have this test every 5 years. Have your cholesterol levels checked more often if:  Your lipid or cholesterol levels are high.  You are older than 53 years of age.  You are at high risk for heart disease. What should I know about cancer screening? Depending on your health history and family history, you may need to have cancer screening at various ages. This may include screening for:  Breast cancer.  Cervical cancer.  Colorectal  cancer.  Skin cancer.  Lung cancer. What should I know about heart disease, diabetes, and high blood pressure? Blood pressure and heart disease  High blood pressure causes heart disease and increases the risk of stroke. This is more likely to develop in people who have high blood pressure readings, are of African descent, or are overweight.  Have your blood pressure checked: ? Every 3-5 years if you are 53-53 years of age. ? Every year if you are 53 years old or older. Diabetes Have regular diabetes screenings. This checks your fasting blood sugar level. Have the screening done:  Once every three years after age 12 if you are at a normal weight and have a low risk for diabetes.  More often and at a younger age if you are overweight or have a high risk for diabetes. What should I know about preventing infection? Hepatitis B If you have a higher risk for hepatitis B, you should be screened for this virus. Talk with your health care provider to find out if you are at risk for hepatitis B infection. Hepatitis C Testing is recommended for:  Everyone born from [birth year] through 1964.  Anyone with known risk factors for hepatitis C. Sexually transmitted infections (STIs)  Get screened for STIs, including gonorrhea and chlamydia, if: ? You are sexually active and are younger than 53 years of age. ? You are older than 53 years of age and your health care provider tells you that you are at risk for this type of infection. ? Your sexual activity has changed since you were  last screened, and you are at increased risk for chlamydia or gonorrhea. Ask your health care provider if you are at risk.  Ask your health care provider about whether you are at high risk for HIV. Your health care provider may recommend a prescription medicine to help prevent HIV infection. If you choose to take medicine to prevent HIV, you should first get tested for HIV. You should then be tested every 3 months for as long as  you are taking the medicine. Pregnancy  If you are about to stop having your period (premenopausal) and you may become pregnant, seek counseling before you get pregnant.  Take 400 to 800 micrograms (mcg) of folic acid every day if you become pregnant.  Ask for birth control (contraception) if you want to prevent pregnancy. Osteoporosis and menopause Osteoporosis is a disease in which the bones lose minerals and strength with aging. This can result in bone fractures. If you are 64 years old or older, or if you are at risk for osteoporosis and fractures, ask your health care provider if you should:  Be screened for bone loss.  Take a calcium or vitamin D supplement to lower your risk of fractures.  Be given hormone replacement therapy (HRT) to treat symptoms of menopause. Follow these instructions at home: Lifestyle  Do not use any products that contain nicotine or tobacco, such as cigarettes, e-cigarettes, and chewing tobacco. If you need help quitting, ask your health care provider.  Do not use street drugs.  Do not share needles.  Ask your health care provider for help if you need support or information about quitting drugs. Alcohol use  Do not drink alcohol if: ? Your health care provider tells you not to drink. ? You are pregnant, may be pregnant, or are planning to become pregnant.  If you drink alcohol: ? Limit how much you use to 0-1 drink a day. ? Limit intake if you are breastfeeding.  Be aware of how much alcohol is in your drink. In the [place] one drink equals one 12 oz bottle of beer (355 mL), one 5 oz glass of wine (148 mL), or one 1 oz glass of hard liquor (44 mL). General instructions  Schedule regular health, dental, and eye exams.  Stay current with your vaccines.  Tell your health care provider if: ? You often feel depressed. ? You have ever been abused or do not feel safe at home. Summary  Adopting a healthy lifestyle and getting preventive care are  important in promoting health and wellness.  Follow your health care provider's instructions about healthy diet, exercising, and getting tested or screened for diseases.  Follow your health care provider's instructions on monitoring your cholesterol and blood pressure. This information is not intended to replace advice given to you by your health care provider. Make sure you discuss any questions you have with your health care provider. Document Revised: 12/21/2017 Document Reviewed: 12/21/2017 Elsevier Patient Education  2020 [place].  Menopause Menopause is the normal time of life when menstrual periods stop completely. It is usually confirmed by 12 months without a menstrual period. The transition to menopause (perimenopause) most often happens between the ages of 4 and 33. During perimenopause, hormone levels change in your body, which can cause symptoms and affect your health. Menopause may increase your risk for:  Loss of bone (osteoporosis), which causes bone breaks (fractures).  Depression.  Hardening and narrowing of the arteries (atherosclerosis), which can cause heart attacks and strokes. What are  the causes? This condition is usually caused by a natural change in hormone levels that happens as you get older. The condition may also be caused by surgery to remove both ovaries (bilateral oophorectomy). What increases the risk? This condition is more likely to start at an earlier age if you have certain medical conditions or treatments, including:  A tumor of the pituitary gland in the brain.  A disease that affects the ovaries and hormone production.  Radiation treatment for cancer.  Certain cancer treatments, such as chemotherapy or hormone (anti-estrogen) therapy.  Heavy smoking and excessive alcohol use.  Family history of early menopause. This condition is also more likely to develop earlier in women who are very thin. What are the signs or symptoms? Symptoms of  this condition include:  Hot flashes.  Irregular menstrual periods.  Night sweats.  Changes in feelings about sex. This could be a decrease in sex drive or an increased comfort around your sexuality.  Vaginal dryness and thinning of the vaginal walls. This may cause painful intercourse.  Dryness of the skin and development of wrinkles.  Headaches.  Problems sleeping (insomnia).  Mood swings or irritability.  Memory problems.  Weight gain.  Hair growth on the face and chest.  Bladder infections or problems with urinating. How is this diagnosed? This condition is diagnosed based on your medical history, a physical exam, your age, your menstrual history, and your symptoms. Hormone tests may also be done. How is this treated? In some cases, no treatment is needed. You and your health care provider should make a decision together about whether treatment is necessary. Treatment will be based on your individual condition and preferences. Treatment for this condition focuses on managing symptoms. Treatment may include:  Menopausal hormone therapy (MHT).  Medicines to treat specific symptoms or complications.  Acupuncture.  Vitamin or herbal supplements. Before starting treatment, make sure to let your health care provider know if you have a personal or family history of:  Heart disease.  Breast cancer.  Blood clots.  Diabetes.  Osteoporosis. Follow these instructions at home: Lifestyle  Do not use any products that contain nicotine or tobacco, such as cigarettes and e-cigarettes. If you need help quitting, ask your health care provider.  Get at least 30 minutes of physical activity on 5 or more days each week.  Avoid alcoholic and caffeinated beverages, as well as spicy foods. This may help prevent hot flashes.  Get 7-8 hours of sleep each night.  If you have hot flashes, try: ? Dressing in layers. ? Avoiding things that may trigger hot flashes, such as spicy  food, warm places, or stress. ? Taking slow, deep breaths when a hot flash starts. ? Keeping a fan in your home and office.  Find ways to manage stress, such as deep breathing, meditation, or journaling.  Consider going to group therapy with other women who are having menopause symptoms. Ask your health care provider about recommended group therapy meetings. Eating and drinking  Eat a healthy, balanced diet that contains whole grains, lean protein, low-fat dairy, and plenty of fruits and vegetables.  Your health care provider may recommend adding more soy to your diet. Foods that contain soy include tofu, tempeh, and soy milk.  Eat plenty of foods that contain calcium and vitamin D for bone health. Items that are rich in calcium include low-fat milk, yogurt, beans, almonds, sardines, broccoli, and kale. Medicines  Take over-the-counter and prescription medicines only as told by your health care provider.  Talk with your health care provider before starting any herbal supplements. If prescribed, take vitamins and supplements as told by your health care provider. These may include: ? Calcium. Women age 36 and older should get 1,200 mg (milligrams) of calcium every day. ? Vitamin D. Women need 600-800 International Units of vitamin D each day. ? Vitamins B12 and B6. Aim for 50 micrograms of B12 and 1.5 mg of B6 each day. General instructions  Keep track of your menstrual periods, including: ? When they occur. ? How heavy they are and how long they last. ? How much time passes between periods.  Keep track of your symptoms, noting when they start, how often you have them, and how long they last.  Use vaginal lubricants or moisturizers to help with vaginal dryness and improve comfort during sex.  Keep all follow-up visits as told by your health care provider. This is important. This includes any group therapy or counseling. Contact a health care provider if:  You are still having  menstrual periods after age 77.  You have pain during sex.  You have not had a period for 12 months and you develop vaginal bleeding. Get help right away if:  You have: ? Severe depression. ? Excessive vaginal bleeding. ? Pain when you urinate. ? A fast or irregular heart beat (palpitations). ? Severe headaches. ? Abdomen (abdominal) pain or severe indigestion.  You fell and you think you have a broken bone.  You develop leg or chest pain.  You develop vision problems.  You feel a lump in your breast. Summary  Menopause is the normal time of life when menstrual periods stop completely. It is usually confirmed by 12 months without a menstrual period.  The transition to menopause (perimenopause) most often happens between the ages of 76 and 78.  Symptoms can be managed through medicines, lifestyle changes, and complementary therapies such as acupuncture.  Eat a balanced diet that is rich in nutrients to promote bone health and heart health and to manage symptoms during menopause. This information is not intended to replace advice given to you by your health care provider. Make sure you discuss any questions you have with your health care provider. Document Revised: 12/10/2016 Document Reviewed: 01/31/2016 Elsevier Patient Education  2020 [employer].

## 2019-11-08 NOTE — Progress Notes (Signed)
   Deborah Terrell 08-02-1965 [id number]   History:  54 y.o. G2P2002 presents for annual exam. No cycle since 04/2019. Complains of mood changes,fatigue, joint pain and weight gain. She had hot flashes over the summer but these have resolved. She would like to discuss natural options for these. Very active typically with cross fit but has not had the drive lately. She also complains of vaginal dryness and painful intercourse. Normal pap and mammogram history.   Gynecologic History LMP 04/2019 Contraception: none Last Pap: 08/09/2017. Results were: normal Last mammogram: 05/25/2019. Results were: normal Last colonoscopy: Never  Past medical history, past surgical history, family history and social history were all reviewed and documented in the EPIC chart.  ROS:  A ROS was performed and pertinent positives and negatives are included.  Exam:  Vitals:   11/08/19 1535  BP: 120/80  Weight: 153 lb (69.4 kg)   Body mass index is 24.69 kg/m.  General appearance:  Normal Thyroid:  Symmetrical, normal in size, without palpable masses or nodularity. Respiratory  Auscultation:  Clear without wheezing or rhonchi Cardiovascular  Auscultation:  Regular rate, without rubs, murmurs or gallops  Edema/varicosities:  Not grossly evident Abdominal  Soft,nontender, without masses, guarding or rebound.  Liver/spleen:  No organomegaly noted  Hernia:  None appreciated  Skin  Inspection:  Grossly normal   Breasts: Examined lying and sitting.   Right: Without masses, retractions, discharge or axillary adenopathy.   Left: Without masses, retractions, discharge or axillary adenopathy. Gentitourinary   Inguinal/mons:  Normal without inguinal adenopathy  External genitalia:  Normal  BUS/Urethra/Skene's glands:  Normal  Vagina:  Normal, atrophic changes  Cervix:  Normal  Uterus:  Anteverted, normal in size, shape and contour.  Midline and mobile  Adnexa/parametria:     Rt: Without masses or  tenderness.   Lt: Without masses or tenderness.  Anus and perineum: Normal  Digital rectal exam: Normal sphincter tone without palpated masses or tenderness  Assessment/Plan:  54 y.o. [id number] for annual exam.   Well female exam with routine gynecological exam - Plan: CBC with Differential/Platelet, Comprehensive metabolic panel. Education provided on SBEs, importance of preventative screenings, current guidelines, high calcium diet, regular exercise, and multivitamin daily.   Flu vaccine need - Plan: Flu Vaccine QUAD 36+ mos IM (Fluarix, Quad PF)  Perimenopause - Last cycle April 2021. Had hot flashes over the summer but these have resolved.  Complains of mood changes, weight gain, fatigue, and joint pain.  We discussed over-the-counter natural options such as black cohosh, Estroven, or ginseng with vitamin E.  We also talked about vaginal dryness and recommendations for OTC Replens 2-3 times per week and coconut oil or OTC lubricants during intercourse.  Screening for cervical cancer -normal Pap history.  Will repeat Pap at 5-year interval per guidelines.  Screening for breast cancer -normal mammogram history.  Continue annual screenings.  Normal breast exam today.  Screening for colon cancer -has not had a screening colonoscopy.  She has her consultation scheduled for December.  Follow-up in 1 year for annual.    Tamela Gammon [place], 3:57 PM 11/08/2019

## 2019-11-09 LAB — CBC WITH DIFFERENTIAL/PLATELET
Absolute Monocytes: 433 cells/uL (ref 200–950)
Basophils Absolute: 40 cells/uL (ref 0–200)
Basophils Relative: 0.7 %
Eosinophils Absolute: 120 cells/uL (ref 15–500)
Eosinophils Relative: 2.1 %
HCT: 35.6 % (ref 35.0–45.0)
Hemoglobin: 12.1 g/dL (ref 11.7–15.5)
Lymphs Abs: 1248 cells/uL (ref 850–3900)
MCH: 32.4 pg (ref 27.0–33.0)
MCHC: 34 g/dL (ref 32.0–36.0)
MCV: 95.2 fL (ref 80.0–100.0)
MPV: 10.6 fL (ref 7.5–12.5)
Monocytes Relative: 7.6 %
Neutro Abs: 3859 cells/uL (ref 1500–7800)
Neutrophils Relative %: 67.7 %
Platelets: 229 10*3/uL (ref 140–400)
RBC: 3.74 10*6/uL — ABNORMAL LOW (ref 3.80–5.10)
RDW: 12.3 % (ref 11.0–15.0)
Total Lymphocyte: 21.9 %
WBC: 5.7 10*3/uL (ref 3.8–10.8)

## 2019-11-09 LAB — COMPREHENSIVE METABOLIC PANEL
AG Ratio: 1.8 (calc) (ref 1.0–2.5)
ALT: 16 U/L (ref 6–29)
AST: 18 U/L (ref 10–35)
Albumin: 4.3 g/dL (ref 3.6–5.1)
Alkaline phosphatase (APISO): 63 U/L (ref 37–153)
BUN: 19 mg/dL (ref 7–25)
CO2: 23 mmol/L (ref 20–32)
Calcium: 10.1 mg/dL (ref 8.6–10.4)
Chloride: 105 mmol/L (ref 98–110)
Creat: 0.81 mg/dL (ref 0.50–1.05)
Globulin: 2.4 g/dL (calc) (ref 1.9–3.7)
Glucose, Bld: 88 mg/dL (ref 65–99)
Potassium: 4 mmol/L (ref 3.5–5.3)
Sodium: 136 mmol/L (ref 135–146)
Total Bilirubin: 0.2 mg/dL (ref 0.2–1.2)
Total Protein: 6.7 g/dL (ref 6.1–8.1)

## 2020-07-03 ENCOUNTER — Ambulatory Visit (INDEPENDENT_AMBULATORY_CARE_PROVIDER_SITE_OTHER): Payer: BC Managed Care – PPO

## 2020-07-03 ENCOUNTER — Other Ambulatory Visit: Payer: Self-pay

## 2020-07-03 ENCOUNTER — Other Ambulatory Visit: Payer: Self-pay | Admitting: Obstetrics & Gynecology

## 2020-07-03 DIAGNOSIS — Z1231 Encounter for screening mammogram for malignant neoplasm of breast: Secondary | ICD-10-CM

## 2020-07-07 ENCOUNTER — Other Ambulatory Visit: Payer: Self-pay | Admitting: Obstetrics & Gynecology

## 2020-07-07 DIAGNOSIS — R928 Other abnormal and inconclusive findings on diagnostic imaging of breast: Secondary | ICD-10-CM

## 2020-07-28 ENCOUNTER — Other Ambulatory Visit: Payer: Self-pay

## 2020-07-28 ENCOUNTER — Ambulatory Visit
Admission: RE | Admit: 2020-07-28 | Discharge: 2020-07-28 | Disposition: A | Discharge status: Home / Self Care | Payer: BC Managed Care – PPO | Source: Ambulatory Visit | Attending: Obstetrics & Gynecology | Admitting: Obstetrics & Gynecology

## 2020-07-28 DIAGNOSIS — R928 Other abnormal and inconclusive findings on diagnostic imaging of breast: Secondary | ICD-10-CM

## 2021-02-19 ENCOUNTER — Other Ambulatory Visit: Payer: Self-pay

## 2021-02-19 ENCOUNTER — Encounter: Payer: Self-pay | Admitting: Nurse Practitioner

## 2021-02-19 ENCOUNTER — Ambulatory Visit (INDEPENDENT_AMBULATORY_CARE_PROVIDER_SITE_OTHER): Payer: BC Managed Care – PPO | Admitting: Nurse Practitioner

## 2021-02-19 VITALS — BP 122/78 | Ht 64.5 in | Wt 153.0 lb

## 2021-02-19 DIAGNOSIS — Z01419 Encounter for gynecological examination (general) (routine) without abnormal findings: Secondary | ICD-10-CM | POA: Diagnosis not present

## 2021-02-19 DIAGNOSIS — E7800 Pure hypercholesterolemia, unspecified: Secondary | ICD-10-CM | POA: Diagnosis not present

## 2021-02-19 DIAGNOSIS — Z780 Asymptomatic menopausal state: Secondary | ICD-10-CM

## 2021-02-19 NOTE — Progress Notes (Signed)
Deborah Terrell [birth year] [id number]   History:  56 y.o. G2P2002 presents for annual exam. Postmenopausal - no HRT. Vasomotor symptoms have improved but she has been struggling with losing weight despite continuing exercise and a healthy diet. Normal pap and mammogram history.   Gynecologic History Patient's last menstrual period was 01/25/2019 (exact date).   Contraception/Family planning: post menopausal status Sexually active: Yes  Health Maintenance Last Pap: 08/09/2017. Results were: Normal, 5-year repeat Last mammogram: 07/03/2020. Results were: Left breast calcifications, follow up showed benign calcifications Last colonoscopy: 01/2020. Results were: Polyps, 3-year recall Last Dexa: Not indicated  Past medical history, past surgical history, family history and social history were all reviewed and documented in the EPIC chart. 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 83 yo daughter in [place]. 55 yo son in [place]. Mother diagnosed with breast cancer at age 43, maternal aunt at 40.   ROS:  A ROS was performed and pertinent positives and negatives are included.  Exam:  Vitals:   02/19/21 1533  BP: 122/78  Weight: 153 lb (69.4 kg)  Height: 5' 4.5" (1.638 m)    Body mass index is 25.86 kg/m.  General appearance:  Normal Thyroid:  Symmetrical, normal in size, without palpable masses or nodularity. Respiratory  Auscultation:  Clear without wheezing or rhonchi Cardiovascular  Auscultation:  Regular rate, without rubs, murmurs or gallops  Edema/varicosities:  Not grossly evident Abdominal  Soft,nontender, without masses, guarding or rebound.  Liver/spleen:  No organomegaly noted  Hernia:  None appreciated  Skin  Inspection:  Grossly normal   Breasts: Examined lying and sitting.   Right: Without masses, retractions, discharge or axillary adenopathy.   Left: Without masses, retractions, discharge or axillary adenopathy. Genitourinary   Inguinal/mons:   Normal without inguinal adenopathy  External genitalia:  Normal appearing vulva with no masses, tenderness, or lesions  BUS/Urethra/Skene's glands:  Normal  Vagina:  Normal appearing with normal color and discharge, no lesions  Cervix:  Normal appearing without discharge or lesions  Uterus:  Normal in size, shape and contour.  Midline and mobile, nontender  Adnexa/parametria:     Rt: Normal in size, without masses or tenderness.   Lt: Normal in size, without masses or tenderness.  Anus and perineum: Normal  Digital rectal exam: Normal sphincter tone without palpated masses or tenderness  Patient informed chaperone available to be present for breast and pelvic exam. Patient has requested no chaperone to be present. Patient has been advised what will be completed during breast and pelvic exam.   Assessment/Plan:  56 y.o. [id number] for annual exam.   Well female exam with routine gynecological exam - Plan: CBC with Differential/Platelet, Comprehensive metabolic panel. Education provided on SBEs, importance of preventative screenings, current guidelines, high calcium diet, regular exercise, and multivitamin daily.   Postmenopausal - no HRT, no bleeding. Has had some improvement in vasomotor symptoms but has been struggling with weight gain despite consistent exercise and diet. She has began swimming. Recommend increasing weightlifting and consume high protein/low carb diet.   Elevated LDL cholesterol level - Plan: Lipid panel  Screening for cervical cancer - Normal Pap history.  Will repeat Pap at 5-year interval per guidelines.  Screening for breast cancer - Normal mammogram history.  Continue annual screenings.  Normal breast exam today.  Screening for colon cancer - Colonoscopy January 2022. 3-year recall recommended due to large polyp.   Screening for osteoporosis - Average risk. Will plan for DXA at age 79. Recommend Vitamin D 600 IU  supplement, high calcium diet, and continue exercise  regimen.   Follow-up in 1 year for annual.    [place], 4:12 PM 02/19/2021

## 2021-02-20 LAB — COMPREHENSIVE METABOLIC PANEL
AG Ratio: 1.6 (calc) (ref 1.0–2.5)
ALT: 17 U/L (ref 6–29)
AST: 17 U/L (ref 10–35)
Albumin: 4.6 g/dL (ref 3.6–5.1)
Alkaline phosphatase (APISO): 64 U/L (ref 37–153)
BUN/Creatinine Ratio: 15 (calc) (ref 6–22)
BUN: 18 mg/dL (ref 7–25)
CO2: 25 mmol/L (ref 20–32)
Calcium: 10.6 mg/dL — ABNORMAL HIGH (ref 8.6–10.4)
Chloride: 105 mmol/L (ref 98–110)
Creat: 1.18 mg/dL — ABNORMAL HIGH (ref 0.50–1.03)
Globulin: 2.8 g/dL (calc) (ref 1.9–3.7)
Glucose, Bld: 97 mg/dL (ref 65–99)
Potassium: 4.3 mmol/L (ref 3.5–5.3)
Sodium: 138 mmol/L (ref 135–146)
Total Bilirubin: 0.4 mg/dL (ref 0.2–1.2)
Total Protein: 7.4 g/dL (ref 6.1–8.1)

## 2021-02-20 LAB — CBC WITH DIFFERENTIAL/PLATELET
Absolute Monocytes: 486 cells/uL (ref 200–950)
Basophils Absolute: 42 cells/uL (ref 0–200)
Basophils Relative: 0.7 %
Eosinophils Absolute: 156 cells/uL (ref 15–500)
Eosinophils Relative: 2.6 %
HCT: 38.9 % (ref 35.0–45.0)
Hemoglobin: 13 g/dL (ref 11.7–15.5)
Lymphs Abs: 1668 cells/uL (ref 850–3900)
MCH: 31.3 pg (ref 27.0–33.0)
MCHC: 33.4 g/dL (ref 32.0–36.0)
MCV: 93.7 fL (ref 80.0–100.0)
MPV: 10.5 fL (ref 7.5–12.5)
Monocytes Relative: 8.1 %
Neutro Abs: 3648 cells/uL (ref 1500–7800)
Neutrophils Relative %: 60.8 %
Platelets: 264 10*3/uL (ref 140–400)
RBC: 4.15 10*6/uL (ref 3.80–5.10)
RDW: 12 % (ref 11.0–15.0)
Total Lymphocyte: 27.8 %
WBC: 6 10*3/uL (ref 3.8–10.8)

## 2021-02-20 LAB — LIPID PANEL
Cholesterol: 226 mg/dL — ABNORMAL HIGH (ref <200)
HDL: 60 mg/dL (ref >=50)
LDL Cholesterol (Calc): 126 mg/dL (calc) — ABNORMAL HIGH
Non-HDL Cholesterol (Calc): 166 mg/dL (calc) — ABNORMAL HIGH (ref <130)
Total CHOL/HDL Ratio: 3.8 (calc) (ref <5.0)
Triglycerides: 257 mg/dL — ABNORMAL HIGH (ref <150)

## 2021-02-23 ENCOUNTER — Other Ambulatory Visit: Payer: Self-pay | Admitting: Nurse Practitioner

## 2021-02-23 DIAGNOSIS — R7989 Other specified abnormal findings of blood chemistry: Secondary | ICD-10-CM

## 2021-02-24 ENCOUNTER — Other Ambulatory Visit: Payer: BC Managed Care – PPO

## 2021-03-20 ENCOUNTER — Encounter: Payer: Self-pay | Admitting: Nurse Practitioner

## 2021-03-23 ENCOUNTER — Other Ambulatory Visit: Payer: Self-pay

## 2021-03-23 ENCOUNTER — Other Ambulatory Visit: Payer: BC Managed Care – PPO

## 2021-03-23 DIAGNOSIS — R7989 Other specified abnormal findings of blood chemistry: Secondary | ICD-10-CM

## 2021-03-24 LAB — COMPREHENSIVE METABOLIC PANEL
AG Ratio: 1.7 (calc) (ref 1.0–2.5)
ALT: 17 U/L (ref 6–29)
AST: 16 U/L (ref 10–35)
Albumin: 4.5 g/dL (ref 3.6–5.1)
Alkaline phosphatase (APISO): 67 U/L (ref 37–153)
BUN: 17 mg/dL (ref 7–25)
CO2: 25 mmol/L (ref 20–32)
Calcium: 10.6 mg/dL — ABNORMAL HIGH (ref 8.6–10.4)
Chloride: 108 mmol/L (ref 98–110)
Creat: 0.97 mg/dL (ref 0.50–1.03)
Globulin: 2.7 g/dL (calc) (ref 1.9–3.7)
Glucose, Bld: 103 mg/dL — ABNORMAL HIGH (ref 65–99)
Potassium: 4.3 mmol/L (ref 3.5–5.3)
Sodium: 139 mmol/L (ref 135–146)
Total Bilirubin: 0.5 mg/dL (ref 0.2–1.2)
Total Protein: 7.2 g/dL (ref 6.1–8.1)

## 2021-08-25 ENCOUNTER — Other Ambulatory Visit: Payer: Self-pay | Admitting: Obstetrics & Gynecology

## 2021-08-25 DIAGNOSIS — Z1231 Encounter for screening mammogram for malignant neoplasm of breast: Secondary | ICD-10-CM

## 2021-09-10 ENCOUNTER — Ambulatory Visit: Payer: BC Managed Care – PPO

## 2021-09-24 ENCOUNTER — Ambulatory Visit (INDEPENDENT_AMBULATORY_CARE_PROVIDER_SITE_OTHER): Payer: BC Managed Care – PPO

## 2021-09-24 DIAGNOSIS — Z1231 Encounter for screening mammogram for malignant neoplasm of breast: Secondary | ICD-10-CM

## 2022-04-05 ENCOUNTER — Ambulatory Visit (INDEPENDENT_AMBULATORY_CARE_PROVIDER_SITE_OTHER): Payer: BC Managed Care – PPO | Admitting: Nurse Practitioner

## 2022-04-05 ENCOUNTER — Other Ambulatory Visit (HOSPITAL_COMMUNITY)
Admission: RE | Admit: 2022-04-05 | Discharge: 2022-04-05 | Disposition: A | Discharge status: Home / Self Care | Payer: BC Managed Care – PPO | Source: Ambulatory Visit | Attending: Nurse Practitioner | Admitting: Nurse Practitioner

## 2022-04-05 ENCOUNTER — Encounter: Payer: Self-pay | Admitting: Nurse Practitioner

## 2022-04-05 VITALS — BP 124/68 | HR 78 | Ht 65.0 in | Wt 149.0 lb

## 2022-04-05 DIAGNOSIS — Z780 Asymptomatic menopausal state: Secondary | ICD-10-CM | POA: Diagnosis not present

## 2022-04-05 DIAGNOSIS — Z01419 Encounter for gynecological examination (general) (routine) without abnormal findings: Secondary | ICD-10-CM | POA: Diagnosis not present

## 2022-04-05 DIAGNOSIS — Z124 Encounter for screening for malignant neoplasm of cervix: Secondary | ICD-10-CM | POA: Diagnosis present

## 2022-04-05 NOTE — Progress Notes (Signed)
Deborah Terrell 17-Sep-1965 [id number]   History:  57 y.o. G2P2002 presents for annual exam. Postmenopausal - no HRT. Wants to discuss HRT. She experiences fatigue, night sweats, and insomnia. Mother diagnosed with breast cancer at age 75, maternal aunt at 28. Elevated calcium levels the last couple of years. Has appointment with endocrinology in June for evaluation. Normal pap and mammogram history.   Gynecologic History Patient's last menstrual period was 01/25/2019 (exact date). Contraception/Family planning: post menopausal status Sexually active: Yes  Health Maintenance Last Pap: 08/09/2017. Results were: Normal neg HPV, 5-year repeat Last mammogram: 09/24/2021. Results were: Normal Last colonoscopy: 01/2020. Results were: Polyps, 3-year recall Last Dexa: Not indicated  Past medical history, past surgical history, family history and social history were all reviewed and documented in the EPIC chart. Married. Retired [employer]. Very active with swimming, golfing and exercise. 21 yo daughter in [place]. 21 yo son in [place]. Mother diagnosed with breast cancer at age 52, maternal aunt at 25.   ROS:  A ROS was performed and pertinent positives and negatives are included.  Exam:  Vitals:   04/05/22 0902  BP: 124/68  Pulse: 78  SpO2: 100%  Weight: 149 lb (67.6 kg)  Height: 5\' 5"  (1.651 m)     Body mass index is 24.79 kg/m.  General appearance:  Normal Thyroid:  Symmetrical, normal in size, without palpable masses or nodularity. Respiratory  Auscultation:  Clear without wheezing or rhonchi Cardiovascular  Auscultation:  Regular rate, without rubs, murmurs or gallops  Edema/varicosities:  Not grossly evident Abdominal  Soft,nontender, without masses, guarding or rebound.  Liver/spleen:  No organomegaly noted  Hernia:  None appreciated  Skin  Inspection:  Grossly normal   Breasts: Examined lying and sitting.   Right: Without masses, retractions, discharge  or axillary adenopathy.   Left: Without masses, retractions, discharge or axillary adenopathy. Genitourinary   Inguinal/mons:  Normal without inguinal adenopathy  External genitalia:  Normal appearing vulva with no masses, tenderness, or lesions  BUS/Urethra/Skene's glands:  Normal  Vagina:  Normal appearing with normal color and discharge, no lesions  Cervix:  Normal appearing without discharge or lesions  Uterus:  Normal in size, shape and contour.  Midline and mobile, nontender  Adnexa/parametria:     Rt: Normal in size, without masses or tenderness.   Lt: Normal in size, without masses or tenderness.  Anus and perineum: Normal  Digital rectal exam: Deferred  Patient informed chaperone available to be present for breast and pelvic exam. Patient has requested no chaperone to be present. Patient has been advised what will be completed during breast and pelvic exam.   Assessment/Plan:  57 y.o. G2P2002 for annual exam.   Well female exam with routine gynecological exam - Education provided on SBEs, importance of preventative screenings, current guidelines, high calcium diet, regular exercise, and multivitamin daily. Labs with PCP.   Postmenopausal - no HRT, no bleeding. Discussed HRT, risks of blood clots, heart attack, stroke and breast cancer, as well as benefits of heart and bone health, symptom management. Mother (73) and maternal aunt (55) with history of breast cancer. Does not believe genetic testing has been done. If she decides to pursue HRT patch is recommended. Recommend magnesium nightly.   Screening for cervical cancer - Plan: Cytology - PAP( [place]). Normal pap history.   Screening for breast cancer - Normal mammogram history.  Continue annual screenings.  Normal breast exam today.  Screening for colon cancer - Colonoscopy January 2022. 3-year recall  recommended due to large polyp.   Screening for osteoporosis - Average risk. Will plan for DXA at age 70.  Follow-up  in 1 year for annual.    Tamela [facility], 10:42 AM 04/05/2022

## 2022-04-06 LAB — CYTOLOGY - PAP
Comment: NEGATIVE
Diagnosis: NEGATIVE
High risk HPV: NEGATIVE

## 2022-04-07 ENCOUNTER — Encounter: Payer: Self-pay | Admitting: Nurse Practitioner

## 2022-10-25 ENCOUNTER — Other Ambulatory Visit: Payer: Self-pay | Admitting: Obstetrics & Gynecology

## 2022-10-25 DIAGNOSIS — Z1231 Encounter for screening mammogram for malignant neoplasm of breast: Secondary | ICD-10-CM

## 2022-11-09 ENCOUNTER — Encounter: Payer: Self-pay | Admitting: Genetic Counselor

## 2022-11-09 ENCOUNTER — Inpatient Hospital Stay: Payer: BC Managed Care – PPO

## 2022-11-09 ENCOUNTER — Inpatient Hospital Stay: Payer: BC Managed Care – PPO | Attending: Internal Medicine | Admitting: Genetic Counselor

## 2022-11-09 ENCOUNTER — Other Ambulatory Visit: Payer: Self-pay | Admitting: Genetic Counselor

## 2022-11-09 DIAGNOSIS — Z8042 Family history of malignant neoplasm of prostate: Secondary | ICD-10-CM

## 2022-11-09 DIAGNOSIS — Z1379 Encounter for other screening for genetic and chromosomal anomalies: Secondary | ICD-10-CM

## 2022-11-09 DIAGNOSIS — Z808 Family history of malignant neoplasm of other organs or systems: Secondary | ICD-10-CM | POA: Diagnosis not present

## 2022-11-09 DIAGNOSIS — Z86006 Personal history of melanoma in-situ: Secondary | ICD-10-CM | POA: Diagnosis not present

## 2022-11-09 DIAGNOSIS — Z803 Family history of malignant neoplasm of breast: Secondary | ICD-10-CM | POA: Diagnosis not present

## 2022-11-09 LAB — GENETIC SCREENING ORDER

## 2022-11-09 NOTE — Progress Notes (Unsigned)
REFERRING PROVIDER: [facility] [address]  [place] [address]  PRIMARY PROVIDER:  [facility]  PRIMARY REASON FOR VISIT:  Encounter Diagnoses  Name Primary?   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 Landa, a 57 y.o. female, was seen for a [facility] cancer genetics consultation at the request of Dr. Gretchen Portela due to a personal and family history of cancer.  Ms. Peterkin presents to clinic today to discuss the possibility of a hereditary predisposition to cancer, to discuss genetic testing, and to further clarify her future cancer risks, as well as potential cancer risks for family members.   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   RISK FACTORS:  Menarche was at age 67.  First live birth at age 25.  OCP use for approximately  10-15  years.  Ovaries intact: yes.  Uterus intact: yes.  Menopausal status: menopause at age 73.  HRT use: 0 years. Colonoscopy: yes, 2022;  she will repeat her colonoscopy in 3 years due to polyps . Mammogram within the last year: yes. Number of breast biopsies: 1 biopsy in 2014, fibroadenoma, small intraductal papilloma with fibrosis, no evidence of atypia or malignancy  Any excessive radiation exposure in the past: no  No past medical history on file.  Past Surgical History:  Procedure Laterality Date   BREAST BIOPSY Left    BREAST EXCISIONAL BIOPSY Left    CESAREAN SECTION      Social History   Socioeconomic History   Marital status: Married    Spouse name: Not on file   Number of children: Not on file   Years of education: Not on file   Highest education level: Not on file  Occupational History   Not on file  Tobacco Use   Smoking status: Never   Smokeless tobacco: Never  Vaping Use    Vaping status: Never Used  Substance and Sexual Activity   Alcohol use: Yes    Alcohol/week: 0.0 standard drinks of alcohol    Comment: Rare   Drug use: No   Sexual activity: Yes    Partners: Male    Birth control/protection: Other-see comments    Comment: vasectomy-1st intercourse 57 yo-Fewer than 5 partners,des neg  Other Topics Concern   Not on file  Social History Narrative   Not on file   Social Determinants of Health   Financial Resource Strain: Not on file  Food Insecurity: Not on file  Transportation Needs: Not on file  Physical Activity: Not on file  Stress: Not on file  Social Connections: Not on file     FAMILY HISTORY:  We obtained a detailed, 4-generation family history.  Significant diagnoses are listed below: Family History  Problem Relation Age of Onset   Diabetes Mother    Breast cancer Mother 62       mastectomy, chemo no radiation   Melanoma Mother 48       metastatic to brain   Diabetes Father    Breast cancer Maternal Aunt 48   Prostate cancer Maternal Uncle 28 - 85   Hypertension Maternal Grandmother    Breast cancer Maternal Grandmother 39   Breast cancer Cousin 70 - 57  paternal first cousin      Ms. Chaudoin's mother was diagnosed with breast cancer at age 52 and metastatic melanoma at age 45, she is currently 22 and declined genetic testing. Her maternal aunt was diagnosed with breast cancer at age 81, she is deceased. Her maternal uncle was diagnosed with prostate cancer at age 57, he died at age 89, possibly due to metastatic prostate cancer. Her maternal grandmother was diagnosed with breast cancer at age 65, she died at age 26. Ms. Golphin's paternal first cousin was diagnosed with breast cancer at age 54. Ms. Cosson is unaware of previous family history of genetic testing for hereditary cancer risks. There is no reported Ashkenazi Jewish ancestry.   GENETIC COUNSELING ASSESSMENT: Ms. Holbert is a 57 y.o. female with a personal and family  history of cancer which is somewhat suggestive of a hereditary predisposition to cancer given multiple generations affected with cancer. We, therefore, discussed and recommended the following at today's visit.   DISCUSSION: We discussed that 5 - 10% of cancer is hereditary, with most cases of breast cancer associated with BRCA1/2.  There are other genes that can be associated with hereditary breast cancer syndromes.  We discussed that testing is beneficial for several reasons including knowing how to best screen individuals and understanding if other family members could be at an increased risk for cancer and allowing them to undergo genetic testing.   We reviewed the characteristics, features and inheritance patterns of hereditary cancer syndromes. We also discussed genetic testing, including the appropriate family members to test, the process of testing, insurance coverage and turn-around-time for results. We discussed the implications of a negative, positive, carrier and/or variant of uncertain significant result. We recommended Ms. Howald pursue genetic testing for a panel that includes genes associated with breast cancer, prostate cancer, and melanoma.   Ms. Fagundo was offered a common hereditary cancer panel (36+melanoma genes) and an expanded pan-cancer panel (70 genes). Ms. Jensen was informed of the benefits and limitations of each panel, including that expanded pan-cancer panels contain genes that do not have clear management guidelines at this point in time.  We also discussed that as the number of genes included on a panel increases, the chances of variants of uncertain significance increases. After considering the benefits and limitations of each gene panel, Ms. Dantonio elected to have [employer] (38 genes).  The CustomNext gene panel offered by [employer] includes sequencing, rearrangement analysis, and RNA analysis for the following 38 genes:  APC, ATM, AXIN2, BAP1, BARD1,  BMPR1A, BRCA1, BRCA2, BRIP1, CDH1, CDK4, CDKN2A, CHEK2, DICER1, HOXB13, EPCAM, GREM1, MITF, MLH1, MSH2, MSH3, MSH6, MUTYH, NF1, NTHL1, PALB2, PMS2, POLD1, POLE, POT1, PTEN, RAD51C, RAD51D, RB1, SMAD4, SMARCA4, STK11, and TP53.   Based on Ms. Malia's personal and family history of cancer, she meets medical criteria for genetic testing. Despite that she meets criteria, she may still have an out of pocket cost. We discussed that if her out of pocket cost for testing is over $100, the laboratory should contact them to discuss self-pay prices, patient pay assistance programs, if applicable, and other billing options.  PLAN: After considering the risks, benefits, and limitations, Ms. Weisinger provided informed consent to pursue genetic testing and the blood sample was sent to [facility] for analysis of the CustomNext Panel. Results should be available within approximately 2-3 weeks' time, at which point they will be disclosed by telephone to Ms. Ozimek, as will any additional recommendations warranted by these results. Ms. Rosene  will receive a summary of her genetic counseling visit and a copy of her results once available. This information will also be available in Epic.   Ms. Habibi questions were answered to her satisfaction today. Our contact information was provided should additional questions or concerns arise. Thank you for the referral and allowing [place] to share in the care of your patient.   Lalla Brothers, MS, [facility] Genetic Counselor [email or web address] (P) [phone]  The patient was seen for a total of 40 minutes in face-to-face genetic counseling. The patient was seen alone.  Drs. Pamelia Hoit and/or Mosetta Putt were available to discuss this case as needed.   _______________________________________________________________________ For Office Staff:  Number of people involved in session: 1 Was an Intern/ student involved with case: yes, Asher Muir

## 2022-11-30 ENCOUNTER — Ambulatory Visit: Payer: Self-pay | Admitting: Genetic Counselor

## 2022-11-30 ENCOUNTER — Telehealth: Payer: Self-pay | Admitting: Genetic Counselor

## 2022-11-30 ENCOUNTER — Encounter: Payer: Self-pay | Admitting: Genetic Counselor

## 2022-11-30 DIAGNOSIS — Z1379 Encounter for other screening for genetic and chromosomal anomalies: Secondary | ICD-10-CM | POA: Insufficient documentation

## 2022-11-30 NOTE — Progress Notes (Signed)
HPI:   Ms. Weinbaum was previously seen in the [facility] due to a personal and family history of cancer and concerns regarding a hereditary predisposition to cancer. Please refer to our prior cancer genetics clinic note for more information regarding our discussion, assessment and recommendations, at the time. Ms. Ruotolo recent genetic test results were disclosed to her, as were recommendations warranted by these results. These results and recommendations are discussed in more detail below.  CANCER HISTORY:  In December 2020, Ms. Gilkerson was diagnosed with melanoma in situ of the forearm.   FAMILY HISTORY:  We obtained a detailed, 4-generation family history.  Significant diagnoses are listed below:      Family History  Problem Relation Age of Onset   Diabetes Mother     Breast cancer Mother 43        mastectomy, chemo no radiation   Melanoma Mother 27        metastatic to brain   Diabetes Father     Breast cancer Maternal Aunt 48   Prostate cancer Maternal Uncle 81 - 85   Hypertension Maternal Grandmother     Breast cancer Maternal Grandmother 35   Breast cancer Cousin 33 - 52        paternal first cousin             Ms. Fix mother was diagnosed with breast cancer at age 27 and metastatic melanoma at age 74, she is currently 8 and declined genetic testing. Her maternal aunt was diagnosed with breast cancer at age 58, she is deceased. Her maternal uncle was diagnosed with prostate cancer at age 1, he died at age 79, possibly due to metastatic prostate cancer. Her maternal grandmother was diagnosed with breast cancer at age 38, she died at age 45. Ms. Reagle's paternal first cousin was diagnosed with breast cancer at age 56. Ms. Bussey is unaware of previous family history of genetic testing for hereditary cancer risks. There is no reported Ashkenazi Jewish ancestry.   GENETIC TEST RESULTS:  The [facility] CustomNext Panel found no pathogenic mutations.    The CustomNext gene panel offered by [employer] includes sequencing, rearrangement analysis, and RNA analysis for the following 38 genes:  APC, ATM, AXIN2, BAP1, BARD1, BMPR1A, BRCA1, BRCA2, BRIP1, CDH1, CDK4, CDKN2A, CHEK2, DICER1, HOXB13, EPCAM, GREM1, MITF, MLH1, MSH2, MSH3, MSH6, MUTYH, NF1, NTHL1, PALB2, PMS2, POLD1, POLE, POT1, PTEN, RAD51C, RAD51D, RB1, SMAD4, SMARCA4, STK11, and TP53.    The test report has been scanned into EPIC and is located under the Molecular Pathology section of the Results Review tab.  A portion of the result report is included below for reference. Genetic testing reported out on 11/25/2022.       Even though a pathogenic variant was not identified, possible explanations for the cancer in the family may include: There may be no hereditary risk for cancer in the family. The cancers in Ms. Holtzer and/or her family may be due to other genetic or environmental factors. There may be a gene mutation in one of these genes that current testing methods cannot detect, but that chance is small. There could be another gene that has not yet been discovered, or that we have not yet tested, that is responsible for the cancer diagnoses in the family.  It is also possible there is a hereditary cause for the cancer in the family that Ms. Oley did not inherit.  Therefore, it is important to remain in touch with cancer genetics  in the future so that we can continue to offer Ms. Baeder the most up to date genetic testing.    ADDITIONAL GENETIC TESTING:  We discussed with Ms. Hockman that her genetic testing was fairly extensive.  If there are genes identified to increase cancer risk that can be analyzed in the future, we would be happy to discuss and coordinate this testing at that time.    CANCER SCREENING RECOMMENDATIONS:  Ms. Lyter test result is considered negative (normal).  This means that we have not identified a hereditary cause for her personal and family history  of cancer at this time.   An individual's cancer risk and medical management are not determined by genetic test results alone. Overall cancer risk assessment incorporates additional factors, including personal medical history, family history, and any available genetic information that may result in a personalized plan for cancer prevention and surveillance. Therefore, it is recommended she continue to follow the cancer management and screening guidelines provided by her primary healthcare provider.  Based on the reported personal and family history, specific cancer screenings for Ms. Berneice Gandy and her family include:  Breast Cancer Screening:  The Tyrer-Cuzick model is one of multiple prediction models developed to estimate an individual's lifetime risk of developing breast cancer. The Tyrer-Cuzick model is endorsed by the [employer] (NCCN). This model includes many risk factors such as family history, endogenous estrogen exposure, and benign breast disease. The calculation is highly-dependent on the accuracy of clinical data provided by the patient and can change over time. The Tyrer-Cuzick model may be repeated to reflect new information in her personal or family history in the future.   Ms. Tibbett Tyrer-Cuzick risk score is 25.3%.  For women with a greater than 20% lifetime risk of breast cancer, the NCCN recommends the following:    1.   Clinical encounter every 6-12 months to begin when identified as being at increased risk, but not before age 43    2.   Annual mammograms, tomosynthesis is recommended starting 10 years earlier than the youngest breast cancer diagnosis in the family or at age 22 (whichever comes first), but not before age 81     6.   Annual breast MRI starting 10 years earlier than the youngest breast cancer diagnosis in the family or at age 54 (whichever comes first), but not before age 25  We discussed that it is reasonable for Ms. Guillotte to  be followed by a high-risk breast cancer clinic. She declined a referral to [facility] High Risk [facility].       RECOMMENDATIONS FOR FAMILY MEMBERS:   Since she did not inherit a mutation in a cancer predisposition gene included on this panel, her children could not have inherited a mutation from her in one of these genes. Individuals in this family might be at some increased risk of developing cancer, over the general population risk, due to the family history of cancer. We recommend women in this family have a yearly mammogram beginning at age 70, or 73 years younger than the earliest onset of cancer, an annual clinical breast exam, and perform monthly breast self-exams.  Other members of the family may still carry a pathogenic variant in one of these genes that Ms. Bart did not inherit. Based on the family history, we recommend her mother have genetic counseling and testing.   FOLLOW-UP:  Cancer genetics is a rapidly advancing field and it is possible that new genetic tests will be appropriate  for her and/or her family members in the future. We encouraged her to remain in contact with cancer genetics on an annual basis so we can update her personal and family histories and let her know of advances in cancer genetics that may benefit this family.   Our contact number was provided. Ms. Rascon questions were answered to her satisfaction, and she knows she is welcome to call us at anytime with additional questions or concerns.   Lalla Brothers, MS, [facility] Genetic Counselor [email or web address] (P) [phone]

## 2022-11-30 NOTE — Telephone Encounter (Signed)
I contacted Ms. Deborah Terrell to discuss her genetic testing results. No pathogenic variants were identified in the 38 genes analyzed. Detailed clinic note to follow.  The test report has been scanned into EPIC and is located under the Molecular Pathology section of the Results Review tab.  A portion of the result report is included below for reference.   Deborah Brothers, MS, [facility] Genetic Counselor [email or web address] (P) [phone]

## 2022-12-01 ENCOUNTER — Ambulatory Visit: Payer: BC Managed Care – PPO

## 2022-12-01 DIAGNOSIS — Z1231 Encounter for screening mammogram for malignant neoplasm of breast: Secondary | ICD-10-CM

## 2022-12-08 ENCOUNTER — Other Ambulatory Visit: Payer: Self-pay | Admitting: Genetic Counselor

## 2022-12-26 IMAGING — MG DIGITAL DIAGNOSTIC UNILAT LEFT W/ CAD
4 series · 4 of 4 positions shown · non-contrast
Comparison: Previous exams including recent screening mammogram
dated 07/03/2020.

CLINICAL DATA: Patient returns today to evaluate LEFT breast
calcifications identified on recent screening mammogram.

EXAM:
DIGITAL DIAGNOSTIC UNILATERAL LEFT MAMMOGRAM WITH CAD
TECHNIQUE: Left digital diagnostic mammography was performed. Mammographic
images were processed with CAD.

[L CC (1 of 2)]
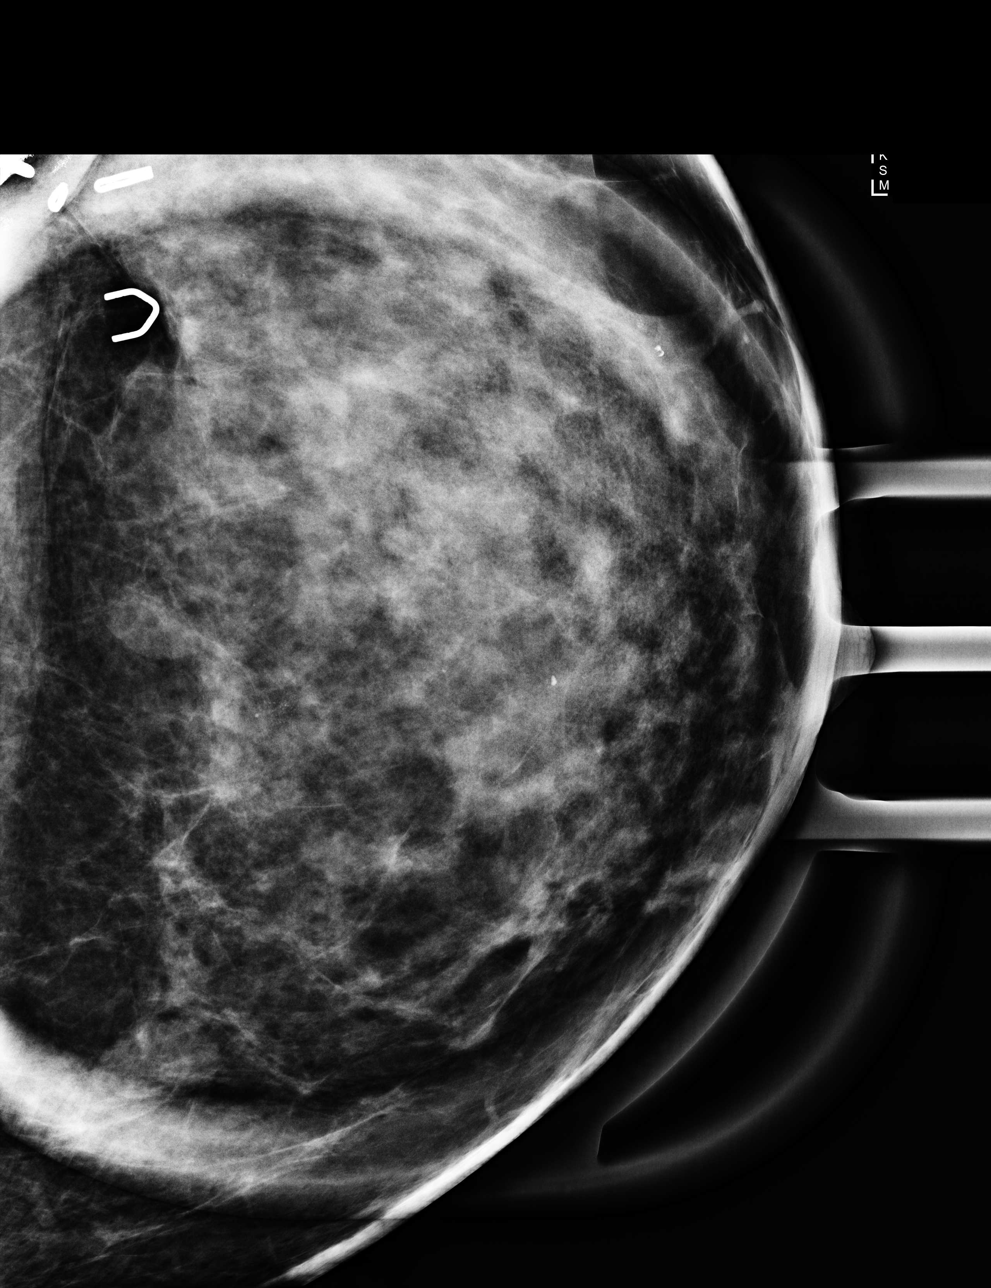

[L ML (1 of 2)]
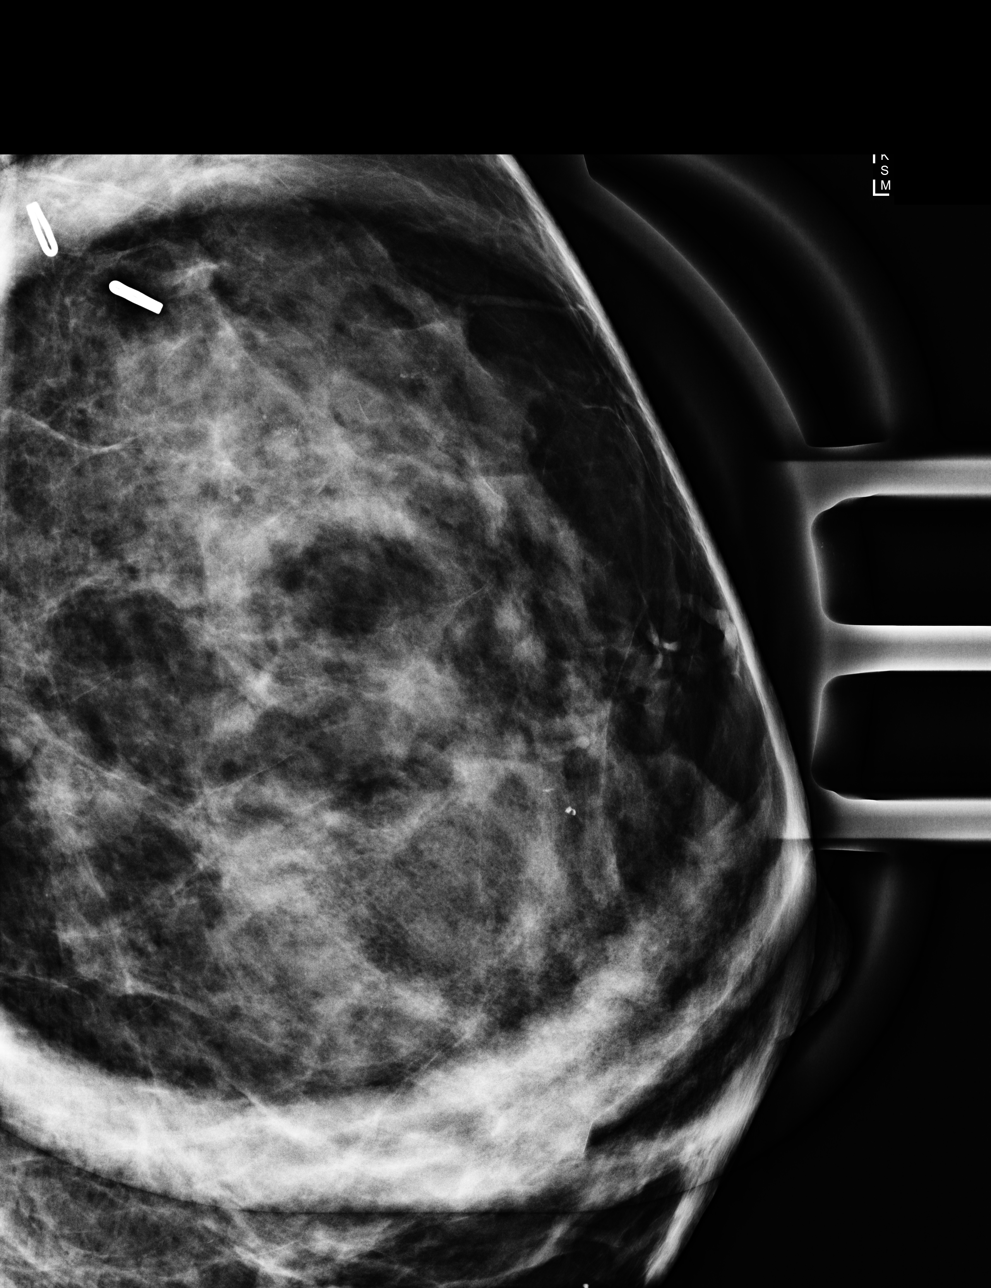

[L ML (2 of 2)]
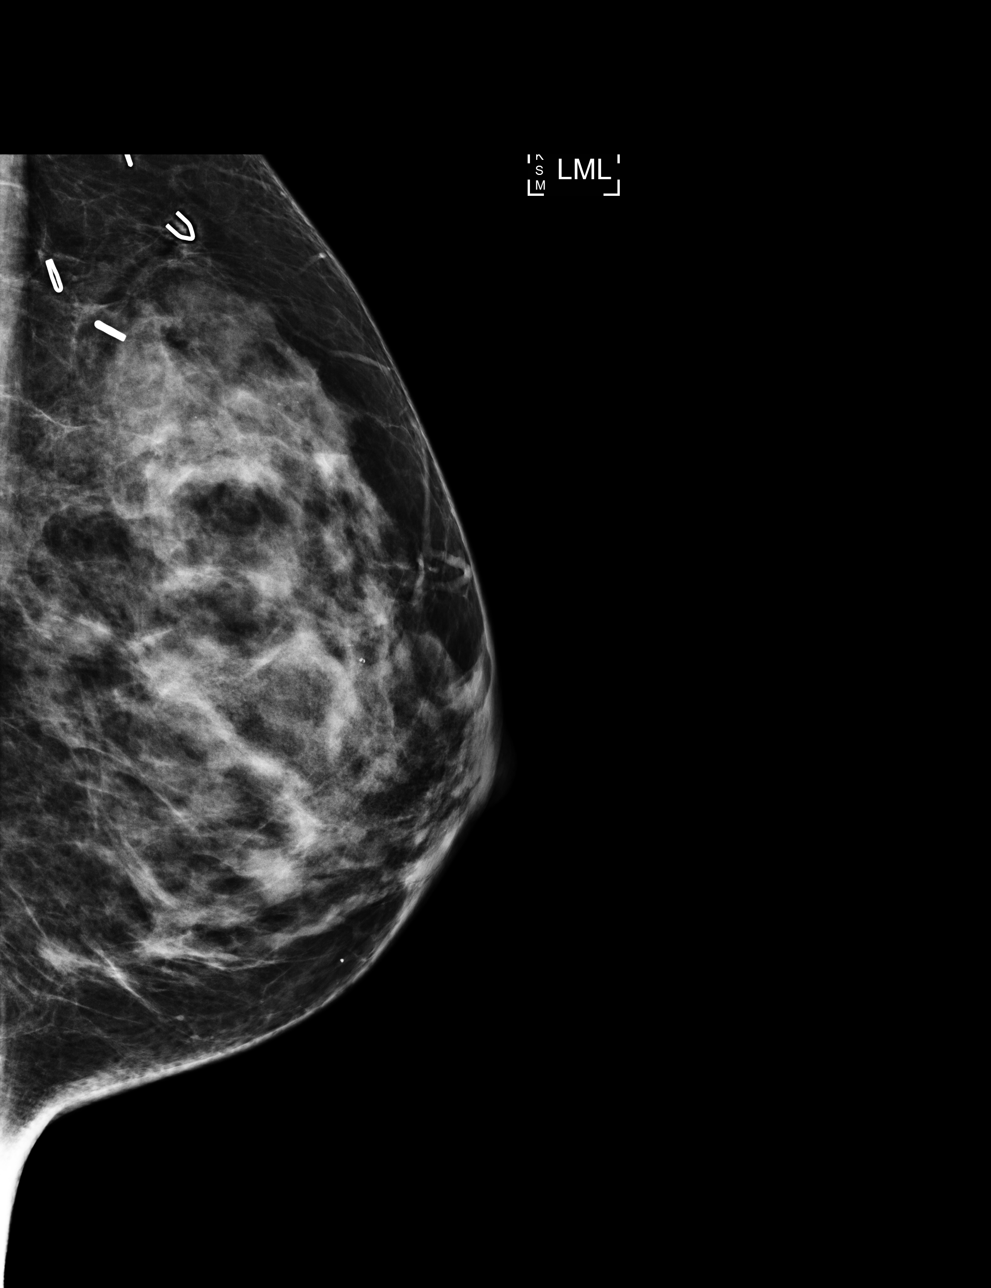

[L CC (2 of 2)]
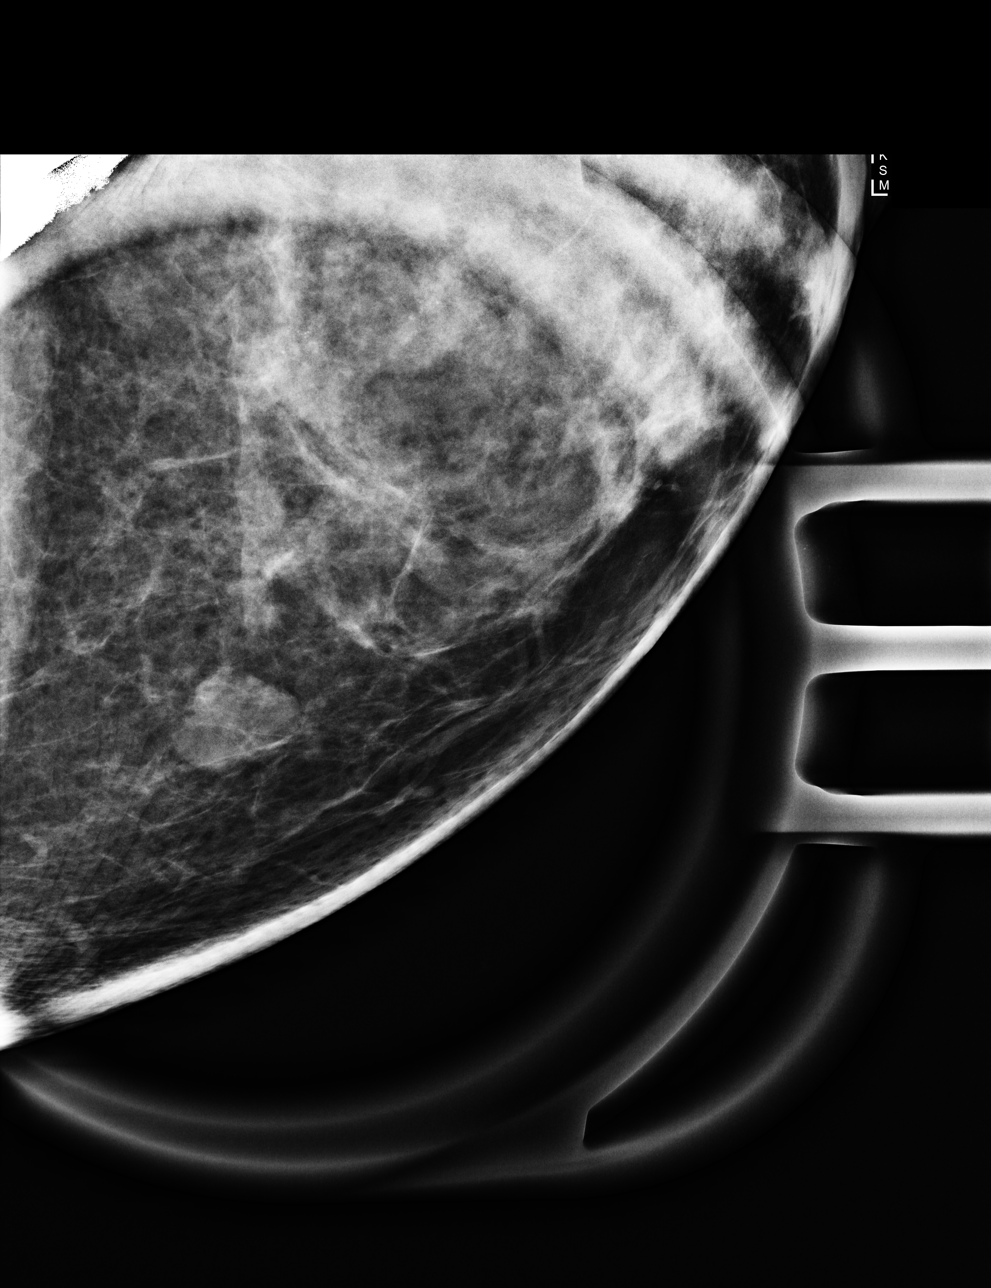

[4 of 4 positions shown; findings below may reference images not displayed]

ACR Breast Density Category c: The breast tissue is heterogeneously
dense, which may obscure small masses.
FINDINGS: On today's additional diagnostic views, including magnification
views, grouped punctate calcifications are confirmed within the
upper LEFT breast, but not significantly changed compared to
previous exams including LEFT breast diagnostic mammogram dated
03/29/2012, confirming benignity. No new pleomorphic or fine linear
branching calcifications.
IMPRESSION: No evidence of malignancy. Benign calcifications within the upper
LEFT breast.

Patient may return to routine annual bilateral screening mammogram
schedule.

RECOMMENDATION:
Screening mammogram in one year.(Code:[id number])

I have discussed the findings and recommendations with the patient.
If applicable, a reminder letter will be sent to the patient
regarding the next appointment.

BI-RADS CATEGORY  2: Benign.

## 2023-04-18 ENCOUNTER — Encounter: Payer: Self-pay | Admitting: Nurse Practitioner

## 2023-04-18 ENCOUNTER — Ambulatory Visit (INDEPENDENT_AMBULATORY_CARE_PROVIDER_SITE_OTHER): Payer: Self-pay | Admitting: Nurse Practitioner

## 2023-04-18 VITALS — BP 124/76 | HR 88 | Ht 64.75 in | Wt 148.0 lb

## 2023-04-18 DIAGNOSIS — N951 Menopausal and female climacteric states: Secondary | ICD-10-CM | POA: Diagnosis not present

## 2023-04-18 DIAGNOSIS — Z01419 Encounter for gynecological examination (general) (routine) without abnormal findings: Secondary | ICD-10-CM

## 2023-04-18 DIAGNOSIS — Z780 Asymptomatic menopausal state: Secondary | ICD-10-CM

## 2023-04-18 NOTE — Progress Notes (Signed)
 Deborah Terrell [birth year] [id number]   History:  58 y.o. G2P2002 presents for annual exam. Postmenopausal - no HRT. Has occasional night sweats, tolerable. Sees endocrinology for elevated calcium. Negative workup, mother also has this. Normal pap and mammogram history.   Gynecologic History Patient's last menstrual period was 01/25/2019 (exact date). Contraception/Family planning: post menopausal status Sexually active: Yes  Health Maintenance Last Pap: 04/05/2022. Results were: Normal neg HPV Last mammogram: 12/01/2022. Results were: Normal Last colonoscopy: 02/2023. Results were: Normal, 5-year recall Last Dexa: Not indicated  Past medical history, past surgical history, family history and social history were all reviewed and documented in the EPIC chart. Married. Retired first [employer]. Very active with swimming, golfing and exercise. 57 yo daughter in [place]. 86 yo son in [place]. Mother diagnosed with breast cancer at age 20, maternal aunt at 30. Negative genetic test for patient 11/2022.   ROS:  A ROS was performed and pertinent positives and negatives are included.  Exam:  Vitals:   04/18/23 1150  BP: 124/76  Pulse: 88  SpO2: 98%  Weight: 148 lb (67.1 kg)  Height: 5' 4.75" (1.645 m)      Body mass index is 24.82 kg/m.  General appearance:  Normal Thyroid:  Symmetrical, normal in size, without palpable masses or nodularity. Respiratory  Auscultation:  Clear without wheezing or rhonchi Cardiovascular  Auscultation:  Regular rate, without rubs, murmurs or gallops  Edema/varicosities:  Not grossly evident Abdominal  Soft,nontender, without masses, guarding or rebound.  Liver/spleen:  No organomegaly noted  Hernia:  None appreciated  Skin  Inspection:  Grossly normal   Breasts: Examined lying and sitting.   Right: Without masses, retractions, discharge or axillary adenopathy.   Left: Without masses, retractions, discharge or axillary  adenopathy. Pelvic: External genitalia:  no lesions              Urethra:  normal appearing urethra with no masses, tenderness or lesions              Bartholins and Skenes: normal                 Vagina: normal appearing vagina with normal color and discharge, no lesions              Cervix: no lesions Bimanual Exam:  Uterus:  no masses or tenderness              Adnexa: no mass, fullness, tenderness              Rectovaginal: Deferred              Anus:  normal, no lesions  Patient informed chaperone available to be present for breast and pelvic exam. Patient has requested no chaperone to be present. Patient has been advised what will be completed during breast and pelvic exam.   Assessment/Plan:  58 y.o. G2P2002 for annual exam.   Well female exam with routine gynecological exam - Education provided on SBEs, importance of preventative screenings, current guidelines, high calcium diet, regular exercise, and multivitamin daily. Labs with PCP.   Postmenopausal - no HRT, no bleeding.  Screening for cervical cancer - Normal pap history. Will repeat at 5-year interval per guidelines.   Screening for breast cancer - Normal mammogram history.  Continue annual screenings.  Normal breast exam today. Mother diagnosed with breast cancer at age 75, maternal aunt at 54. Negative genetic test for patient 11/2022.   Screening for colon cancer - Colonoscopy February 2025. Will  repeat at 5-year interval per GI recommendation.   Screening for osteoporosis - Average risk. Will plan for DXA at age 23.  Return in about 1 year (around 04/17/2024) for Annual.    [place] Mackie [facility], 12:15 PM 04/18/2023

## 2023-04-20 ENCOUNTER — Encounter: Payer: Self-pay | Admitting: Nurse Practitioner

## 2023-11-29 ENCOUNTER — Other Ambulatory Visit: Payer: Self-pay | Admitting: Nurse Practitioner

## 2023-11-29 DIAGNOSIS — Z1231 Encounter for screening mammogram for malignant neoplasm of breast: Secondary | ICD-10-CM

## 2023-12-15 ENCOUNTER — Ambulatory Visit

## 2023-12-15 DIAGNOSIS — Z1231 Encounter for screening mammogram for malignant neoplasm of breast: Secondary | ICD-10-CM
